# Patient Record
Sex: Male | Born: 1970 | Race: White | Hispanic: No | Marital: Married | Smoking: Never smoker
Health system: Southern US, Community
[De-identification: ages and names within clinical notes are randomized; demographics above are authoritative.]

## PROBLEM LIST (undated history)

## (undated) DIAGNOSIS — F418 Other specified anxiety disorders: Secondary | ICD-10-CM

## (undated) DIAGNOSIS — G5621 Lesion of ulnar nerve, right upper limb: Secondary | ICD-10-CM

## (undated) DIAGNOSIS — E041 Nontoxic single thyroid nodule: Secondary | ICD-10-CM

## (undated) DIAGNOSIS — E042 Nontoxic multinodular goiter: Secondary | ICD-10-CM

## (undated) DIAGNOSIS — T884XXA Failed or difficult intubation, initial encounter: Secondary | ICD-10-CM

## (undated) DIAGNOSIS — K589 Irritable bowel syndrome without diarrhea: Secondary | ICD-10-CM

## (undated) DIAGNOSIS — T7840XA Allergy, unspecified, initial encounter: Secondary | ICD-10-CM

## (undated) DIAGNOSIS — K8020 Calculus of gallbladder without cholecystitis without obstruction: Secondary | ICD-10-CM

## (undated) DIAGNOSIS — M1990 Unspecified osteoarthritis, unspecified site: Secondary | ICD-10-CM

## (undated) DIAGNOSIS — J189 Pneumonia, unspecified organism: Secondary | ICD-10-CM

## (undated) DIAGNOSIS — R768 Other specified abnormal immunological findings in serum: Secondary | ICD-10-CM

## (undated) DIAGNOSIS — I10 Essential (primary) hypertension: Secondary | ICD-10-CM

## (undated) DIAGNOSIS — M069 Rheumatoid arthritis, unspecified: Secondary | ICD-10-CM

## (undated) DIAGNOSIS — E785 Hyperlipidemia, unspecified: Secondary | ICD-10-CM

## (undated) DIAGNOSIS — F419 Anxiety disorder, unspecified: Secondary | ICD-10-CM

## (undated) DIAGNOSIS — K219 Gastro-esophageal reflux disease without esophagitis: Secondary | ICD-10-CM

## (undated) DIAGNOSIS — F411 Generalized anxiety disorder: Secondary | ICD-10-CM

## (undated) DIAGNOSIS — K638219 Small intestinal bacterial overgrowth, unspecified: Secondary | ICD-10-CM

## (undated) HISTORY — DX: Hyperlipidemia, unspecified: E78.5

## (undated) HISTORY — DX: Small intestinal bacterial overgrowth, unspecified: K63.8219

## (undated) HISTORY — DX: Failed or difficult intubation, initial encounter: T88.4XXA

## (undated) HISTORY — PX: VASECTOMY: SHX75

## (undated) HISTORY — DX: Nontoxic single thyroid nodule: E04.1

## (undated) HISTORY — DX: Unspecified osteoarthritis, unspecified site: M19.90

## (undated) HISTORY — DX: Other specified abnormal immunological findings in serum: R76.8

## (undated) HISTORY — DX: Allergy, unspecified, initial encounter: T78.40XA

## (undated) HISTORY — PX: CHOLECYSTECTOMY: SHX55

## (undated) HISTORY — PX: WISDOM TOOTH EXTRACTION: SHX21

## (undated) HISTORY — DX: Nontoxic multinodular goiter: E04.2

## (undated) HISTORY — PX: OTHER SURGICAL HISTORY: SHX169

## (undated) HISTORY — DX: Irritable bowel syndrome, unspecified: K58.9

## (undated) HISTORY — PX: KNEE SURGERY: SHX244

## (undated) HISTORY — DX: Gastro-esophageal reflux disease without esophagitis: K21.9

## (undated) HISTORY — DX: Calculus of gallbladder without cholecystitis without obstruction: K80.20

## (undated) HISTORY — PX: TONSILLECTOMY: SUR1361

## (undated) HISTORY — DX: Essential (primary) hypertension: I10

## (undated) HISTORY — DX: Generalized anxiety disorder: F41.1

## (undated) HISTORY — PX: HERNIA REPAIR: SHX51

## (undated) HISTORY — PX: INGUINAL HERNIA REPAIR: SUR1180

---
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
## 2007-05-14 ENCOUNTER — Encounter: Admission: RE | Admit: 2007-05-14 | Discharge: 2007-05-14 | Payer: Self-pay | Admitting: Rheumatology

## 2008-03-01 ENCOUNTER — Emergency Department: Payer: Self-pay | Admitting: Emergency Medicine

## 2012-01-08 ENCOUNTER — Ambulatory Visit (INDEPENDENT_AMBULATORY_CARE_PROVIDER_SITE_OTHER): Payer: Self-pay | Admitting: Family Medicine

## 2012-01-08 ENCOUNTER — Encounter: Payer: Self-pay | Admitting: Family Medicine

## 2012-01-08 VITALS — BP 137/91 | HR 90 | Wt 260.0 lb

## 2012-01-08 DIAGNOSIS — R1031 Right lower quadrant pain: Secondary | ICD-10-CM

## 2012-01-08 DIAGNOSIS — R109 Unspecified abdominal pain: Secondary | ICD-10-CM | POA: Insufficient documentation

## 2012-01-08 LAB — CBC WITH DIFFERENTIAL/PLATELET
Eosinophils Absolute: 0.2 10*3/uL (ref 0.0–0.7)
Eosinophils Relative: 2 % (ref 0–5)
HCT: 46 % (ref 39.0–52.0)
Hemoglobin: 16.2 g/dL (ref 13.0–17.0)
Lymphs Abs: 2.3 10*3/uL (ref 0.7–4.0)
MCH: 30.3 pg (ref 26.0–34.0)
MCV: 86 fL (ref 78.0–100.0)
Monocytes Absolute: 0.5 10*3/uL (ref 0.1–1.0)
Monocytes Relative: 8 % (ref 3–12)
Neutrophils Relative %: 56 % (ref 43–77)
RBC: 5.35 MIL/uL (ref 4.22–5.81)

## 2012-01-08 LAB — COMPLETE METABOLIC PANEL WITH GFR
Albumin: 4.3 g/dL (ref 3.5–5.2)
CO2: 29 mEq/L (ref 19–32)
GFR, Est African American: >89 mL/min
GFR, Est Non African American: 88 mL/min
Glucose, Bld: 80 mg/dL (ref 70–99)
Sodium: 141 mEq/L (ref 135–145)
Total Bilirubin: 0.7 mg/dL (ref 0.3–1.2)
Total Protein: 7 g/dL (ref 6.0–8.3)

## 2012-01-08 NOTE — Progress Notes (Signed)
CC: Eugene Coleman is a 41 y.o. male is here for Establish Care and Abdominal Pain   Subjective: HPI:  Very pleasant 41 year old presents for right-sided abdominal pain is been present for 3 months. This started days after he quit caffeinated beverages and chewing tobacco. Is described as a pressure and a bloating in the right mid to right lower quadrant of his abdomen. It is present 24 hours a day and is worse with running, deep breaths, and if he eats more than small amounts of food at a meal. Interestingly he denies any weight loss, if anything he believes she's gained 20 pounds in the last 3 months. He reports some constipation initially but has been using stool softeners and magnesium citrate to keep a daily nonpainful bowel movement. The pain is typically not radiating but he noticed a couple weeks ago but it did radiate to the right lower flank. He denies mucus or blood in the stool nor tar-like stool. Often the pain is to a degree where he breaks out in a sweat. Typically the pain as a 6/10 in severity. He denies fevers, chills, night sweats, joint pain, eye complaints, skin complaints, shortness of breath, chest pain, genitourinary complaints, nausea, vomiting, right upper quadrant pain, back pain, right scapular pain. Interventions have also included ranitidine without any resolution of his pain.  No known family history of colon cancer, mother does have Crohn's disease.  Review Of Systems Outlined In HPI  History reviewed. No pertinent past medical history.   Family History  Problem Relation Age of Onset  . Breast cancer Mother   . Hypertension Mother     father  . Crohn's disease Mother   . Heart attack      grandfather  . Stroke      grandfather     History  Substance Use Topics  . Smoking status: Never Smoker   . Smokeless tobacco: Former Neurosurgeon    Quit date: 01/08/1999  . Alcohol Use: No     Objective: Filed Vitals:   01/08/12 0833  BP: 137/91  Pulse: 90     General: Alert and Oriented, No Acute Distress HEENT: Pupils equal, round, reactive to light. Conjunctivae clear.  Moist mucous membranes, pharynx without inflammation nor lesions.  Lungs: Clear to auscultation bilaterally, no wheezing/ronchi/rales.  Comfortable work of breathing. Good air movement. Cardiac: Regular rate and rhythm. Normal S1/S2.  No murmurs, rubs, nor gallops.   Abdomen: Normal bowel sounds, no midline pulsatile mass, Murphy's sign negative, no right upper quadrant pain, no palpable hepatomegaly, no palpable masses. Pain with deep palpation in the superior right lower quadrant without rebound tenderness, no pain with heel strike. No palpable hernias with Valsalva Skin: Warm and dry.  Assessment & Plan: Eugene Coleman was seen today for establish care and abdominal pain.  Diagnoses and associated orders for this visit:  Right lower quadrant pain - Fecal lactoferrin - CBC with Differential - COMPLETE METABOLIC PANEL WITH GFR - Hemoccult (HOME) Blood/Stoool Cards; Future    Concern for mass versus Crohn's versus hepatobiliary disease. Patient does not have insurance therefore he would like to hold off on imaging at this time until labs help narrow down the differential. He's going to start actively looking for insurance coverage. I will call him with the above results to help determine appropriate followup.  Return f/u based on results.

## 2012-04-08 ENCOUNTER — Emergency Department
Admission: EM | Admit: 2012-04-08 | Discharge: 2012-04-08 | Disposition: A | Discharge status: Home / Self Care | Payer: Self-pay | Source: Home / Self Care | Attending: Family Medicine | Admitting: Family Medicine

## 2012-04-08 NOTE — ED Notes (Addendum)
Eugene Coleman complains of swelling in his gums and pain that is a 10/10. He had his tooth pulled on the 17 th of January by Dr Josetta Huddle. Dr Junie Spencer office is closed for the next three days. Denies fever, chills or sweats.

## 2012-04-25 ENCOUNTER — Other Ambulatory Visit: Payer: Self-pay

## 2012-08-06 ENCOUNTER — Encounter: Payer: Self-pay | Admitting: *Deleted

## 2012-08-06 ENCOUNTER — Emergency Department
Admission: EM | Admit: 2012-08-06 | Discharge: 2012-08-06 | Disposition: A | Discharge status: Home / Self Care | Payer: Self-pay | Source: Home / Self Care | Attending: Family Medicine | Admitting: Family Medicine

## 2012-08-06 DIAGNOSIS — J069 Acute upper respiratory infection, unspecified: Secondary | ICD-10-CM

## 2012-08-06 DIAGNOSIS — J40 Bronchitis, not specified as acute or chronic: Secondary | ICD-10-CM

## 2012-08-06 MED ORDER — AZITHROMYCIN 250 MG PO TABS: ORAL_TABLET | ORAL | Status: DC

## 2012-08-06 NOTE — ED Provider Notes (Signed)
History     CSN: [id number]  Arrival date & time 08/06/12  1020   First MD Initiated Contact with Patient 08/06/12 1034      Chief Complaint  Patient presents with  . URI   HPI  URI Symptoms Onset: 2 weeks  Description: rhinorrhea, nasal congestion, cough  Modifying factors:  Sxs initially improved, then recurred. Baseline hx/o allergies. Also with hx/o PNA 2012.   Symptoms Nasal discharge: yes Fever: no Sore throat: no Cough: yes Wheezing: no Ear pain: no GI symptoms: no Sick contacts: no  Red Flags  Stiff neck: no Dyspnea: no Rash: no Swallowing difficulty: no  Sinusitis Risk Factors Headache/face pain: no Double sickening: no tooth pain: no  Allergy Risk Factors Sneezing: yes Itchy scratchy throat: yes Seasonal symptoms: yes  Flu Risk Factors Headache: no muscle aches: no severe fatigue: no   History reviewed. No pertinent past medical history.  Past Surgical History  Procedure Laterality Date  . Knee surgery  [phone]  . Tonsillectomy      Family History  Problem Relation Age of Onset  . Breast cancer Mother   . Hypertension Mother     father  . Crohn's disease Mother   . Heart attack      grandfather  . Stroke      grandfather    History  Substance Use Topics  . Smoking status: Never Smoker   . Smokeless tobacco: Former Neurosurgeon    Quit date: 01/08/1999  . Alcohol Use: No      Review of Systems  All other systems reviewed and are negative.    Allergies  Penicillins  Home Medications  No current outpatient prescriptions on file.  BP 133/85  Pulse 66  Temp(Src) 98 F (36.7 C) (Oral)  Resp 14  Ht 6\' 3"  (1.905 m)  Wt 256 lb (116.121 kg)  BMI 32 kg/m2  SpO2 96%  Physical Exam  Constitutional: He appears well-developed and well-nourished.  HENT:  Head: Normocephalic and atraumatic.  Eyes: Conjunctivae are normal. Pupils are equal, round, and reactive to light.  Neck: Normal range of motion.  Cardiovascular:  Normal rate and regular rhythm.   Pulmonary/Chest: Effort normal and breath sounds normal. He has no wheezes.  Abdominal: Soft.  Musculoskeletal: Normal range of motion.  Neurological: He is alert.  Skin: Skin is warm.    ED Course  Procedures (including critical care time)  Labs Reviewed - No data to display No results found.   1. URI (upper respiratory infection)   2. Bronchitis       MDM  Will place on zpak for lower resp/atypical coverage Discussed infectious and resp red flags.  Follow up as needed.      The patient and/or caregiver has been counseled thoroughly with regard to treatment plan and/or medications prescribed including dosage, schedule, interactions, rationale for use, and possible side effects and they verbalize understanding. Diagnoses and expected course of recovery discussed and will return if not improved as expected or if the condition worsens. Patient and/or caregiver verbalized understanding.             Doree Albee, MD 08/06/12 1044

## 2012-08-06 NOTE — ED Notes (Signed)
Eugene Coleman c/o cold/uri symptoms 1 weeks ago. Most symptoms have resolved but he has developed a productive cough with green sputum. Denies fever.

## 2012-11-06 ENCOUNTER — Encounter: Payer: Self-pay | Admitting: Family Medicine

## 2012-11-06 ENCOUNTER — Ambulatory Visit (INDEPENDENT_AMBULATORY_CARE_PROVIDER_SITE_OTHER): Payer: Self-pay | Admitting: Family Medicine

## 2012-11-06 VITALS — BP 154/104 | HR 70 | Wt 272.0 lb

## 2012-11-06 DIAGNOSIS — F411 Generalized anxiety disorder: Secondary | ICD-10-CM

## 2012-11-06 DIAGNOSIS — F410 Panic disorder [episodic paroxysmal anxiety] without agoraphobia: Secondary | ICD-10-CM | POA: Insufficient documentation

## 2012-11-06 DIAGNOSIS — I10 Essential (primary) hypertension: Secondary | ICD-10-CM

## 2012-11-06 HISTORY — DX: Generalized anxiety disorder: F41.1

## 2012-11-06 MED ORDER — PAROXETINE HCL 20 MG PO TABS: 20.0000 mg | ORAL_TABLET | Freq: Every day | ORAL | Status: DC

## 2012-11-06 NOTE — Progress Notes (Signed)
CC: Eugene Coleman is a 42 y.o. male is here for Stress   Subjective: HPI:  Patient presents with concerns of anxiety is not getting in the way of his quality of life. Is described as moderate in severity present on a daily basis worse when at his work improves greatly with exercise or when spending time with his family. He has been self-medicating with coffee drinking 4-5 cups everyday unfortunately this interferes with his ability to exercise as he has no desire to exercise while taking this much coffee.  When he was in his mid 31s he was taking Paxil 20 mg daily for about 7 years for anxiety and depression. Since stopping this medication he is noticed that he has anxiety with being in large social situations and also feels somewhat subjectively depressed when his anxiety is at its worst. Denies paranoia, hallucinations, sleep disturbance, appetite change, possibly harm himself or others. Denies alcohol or recreational drug use. Home life is fantastic, job his only source of stress  Patient denies chest pain, shortness of breath, orthopnea, peripheral edema or motor sensory disturbances   Review Of Systems Outlined In HPI  Past Medical History  Diagnosis Date  . Generalized anxiety disorder 11/06/2012     Family History  Problem Relation Age of Onset  . Breast cancer Mother   . Hypertension Mother     father  . Crohn's disease Mother   . Heart attack      grandfather  . Stroke      grandfather     History  Substance Use Topics  . Smoking status: Never Smoker   . Smokeless tobacco: Former Neurosurgeon    Quit date: 01/08/1999  . Alcohol Use: No     Objective: Filed Vitals:   11/06/12 0930  BP: 154/104  Pulse: 70    Vital signs reviewed. General: Alert and Oriented, No Acute Distress HEENT: Pupils equal, round, reactive to light. Conjunctivae clear.  External ears unremarkable.  Moist mucous membranes. Lungs: Clear and comfortable work of breathing, speaking in full  sentences without accessory muscle use. Cardiac: Regular rate and rhythm.  Neuro: CN II-XII grossly intact, gait normal. Extremities: No peripheral edema.  Strong peripheral pulses.  Mental Status: No depression, anxiety, nor agitation. Logical though process. Skin: Warm and dry.  Assessment & Plan: Koleton was seen today for stress.  Diagnoses and associated orders for this visit:  Essential hypertension, benign  Generalized anxiety disorder - PARoxetine (PAXIL) 20 MG tablet; Take 1 tablet (20 mg total) by mouth daily.    Generalized anxiety disorder: Worsened, we discussed that exercise would be quite helpful for this, he is going to cut back on coffee he would like to restart Paxil, we discussed possibly using citalopram for financial reasons however he has more psychological confidence with Paxil. Discuss formal diagnosis of essential hypertension: He is from reservations about starting blood pressure medication but is agreeable to considering this if not improved with cutting back on coffee restarting exercise cutting sodium intake  Return in about 4 weeks (around 12/04/2012).

## 2012-12-09 ENCOUNTER — Encounter: Payer: Self-pay | Admitting: Family Medicine

## 2012-12-09 ENCOUNTER — Ambulatory Visit (INDEPENDENT_AMBULATORY_CARE_PROVIDER_SITE_OTHER): Payer: Self-pay | Admitting: Family Medicine

## 2012-12-09 VITALS — BP 137/87 | HR 87 | Wt 273.0 lb

## 2012-12-09 DIAGNOSIS — Z09 Encounter for follow-up examination after completed treatment for conditions other than malignant neoplasm: Secondary | ICD-10-CM

## 2012-12-09 DIAGNOSIS — I10 Essential (primary) hypertension: Secondary | ICD-10-CM

## 2012-12-09 DIAGNOSIS — F419 Anxiety disorder, unspecified: Secondary | ICD-10-CM

## 2012-12-09 DIAGNOSIS — K219 Gastro-esophageal reflux disease without esophagitis: Secondary | ICD-10-CM | POA: Insufficient documentation

## 2012-12-09 DIAGNOSIS — G4700 Insomnia, unspecified: Secondary | ICD-10-CM

## 2012-12-09 HISTORY — DX: Anxiety disorder, unspecified: F41.9

## 2012-12-09 MED ORDER — ZOLPIDEM TARTRATE 5 MG PO TABS: 5.0000 mg | ORAL_TABLET | Freq: Every evening | ORAL | Status: DC | PRN

## 2012-12-09 MED ORDER — PANTOPRAZOLE SODIUM 40 MG PO TBEC: 40.0000 mg | DELAYED_RELEASE_TABLET | Freq: Every day | ORAL | Status: DC

## 2012-12-09 MED ORDER — METOPROLOL TARTRATE 25 MG PO TABS: 25.0000 mg | ORAL_TABLET | Freq: Two times a day (BID) | ORAL | Status: DC

## 2012-12-09 NOTE — Progress Notes (Signed)
CC: Eugene Coleman is a 42 y.o. male is here for hospital f/u   Subjective: HPI:  Hospital followup for admission September 29 through 30th for chest pain. Workup included normal chest x-ray, normal EKG, normal serial cardiac enzymes, normal nuclear stress with EF of 53%, A1c of 5.3, normal TSH, normal CMP, hemoglobin of 15.7. LDL 118 triglycerides 122.  There was a suspicion that his pain was coming from gastric reflux he was switched from ranitidine to Protonix 40 mg taken daily.  He describes the chest pain occurred on Friday and Saturday and Sunday he was described as a squeezing deep in his chest that would last for minutes gradually would start gradually would decrease with nothing particularly making it better or worse present with activity and at rest. It would radiate into the left arm. Did not respond to nitroglycerin, noticed few subjective improvement once he was started on metoprolol.  Since discharge he denies any chest discomfort shortness of breath nor motor or sensory disturbances.  Patient has not started Protonix yet he did not get a prescription at time of discharge. He currently denies any epigastric pain, sternal discomfort nor dysphagia.  Patient reports that his anxiety was significantly improved soon after starting on metoprolol in the hospital anxiety has returned since discharge since he did not get a prescription of this. He did not tolerate Paxil due to confusion when he restarted this last month  Patient complains of trouble sleeping that has been present ever since switching shifts 3 weeks ago. Described as inability to stay asleep longer than 2 hours with some difficulty falling asleep. Moderate severity has not been getting better or worse since onset. Denies mental disturbance other than above, paranoia, nor pain keeping him awake at night   Review Of Systems Outlined In HPI  Past Medical History  Diagnosis Date  . Generalized anxiety disorder 11/06/2012      Family History  Problem Relation Age of Onset  . Breast cancer Mother   . Hypertension Mother     father  . Crohn's disease Mother   . Heart attack      grandfather  . Stroke      grandfather     History  Substance Use Topics  . Smoking status: Never Smoker   . Smokeless tobacco: Former Neurosurgeon    Quit date: 01/08/1999  . Alcohol Use: No     Objective: Filed Vitals:   12/09/12 1401  BP: 137/87  Pulse: 87    General: Alert and Oriented, No Acute Distress HEENT: Pupils equal, round, reactive to light. Conjunctivae clear.  Moist mucous membranes pharynx unremarkable Lungs: Clear to auscultation bilaterally, no wheezing/ronchi/rales.  Comfortable work of breathing. Good air movement. Cardiac: Regular rate and rhythm. Normal S1/S2.  No murmurs, rubs, nor gallops.   Abdomen: Normal bowel sounds, soft and non tender without palpable masses. Extremities: No peripheral edema.  Strong peripheral pulses.  Mental Status: No depression, anxiety, nor agitation. Skin: Warm and dry.  Assessment & Plan: Gurkaran was seen today for hospital f/u.  Diagnoses and associated orders for this visit:  Hospital discharge follow-up  Insomnia - zolpidem (AMBIEN) 5 MG tablet; Take 1 tablet (5 mg total) by mouth at bedtime as needed for sleep.  Essential hypertension, benign - metoprolol tartrate (LOPRESSOR) 25 MG tablet; Take 1 tablet (25 mg total) by mouth 2 (two) times daily.  GERD (gastroesophageal reflux disease) - pantoprazole (PROTONIX) 40 MG tablet; Take 1 tablet (40 mg total) by mouth daily.  I suspect that his chest discomfort was due to anxiety and/or GERD. Given his significant improvement of anxiety and blood pressure he is open to the idea of continuing metoprolol prescription was provided. Essential hypertension: Start metoprolol GERD: Suspect this is uncontrolled due to chest pain, Start Protonix Insomnia: Start Ambien  40 minutes spent face-to-face during visit today  of which at least 50% was counseling or coordinating care regarding Hospital discharge followup, insomnia, essential hypertension, anxiety, GERD.   Return in about 4 weeks (around 01/06/2013).

## 2013-01-14 ENCOUNTER — Other Ambulatory Visit: Payer: Self-pay

## 2014-03-30 ENCOUNTER — Encounter: Payer: Self-pay | Admitting: Family Medicine

## 2014-03-30 ENCOUNTER — Ambulatory Visit (INDEPENDENT_AMBULATORY_CARE_PROVIDER_SITE_OTHER): Payer: Self-pay | Admitting: Family Medicine

## 2014-03-30 VITALS — BP 151/87 | HR 122 | Wt 256.0 lb

## 2014-03-30 DIAGNOSIS — I10 Essential (primary) hypertension: Secondary | ICD-10-CM

## 2014-03-30 DIAGNOSIS — F411 Generalized anxiety disorder: Secondary | ICD-10-CM

## 2014-03-30 MED ORDER — PAROXETINE HCL 20 MG PO TABS: 20.0000 mg | ORAL_TABLET | Freq: Every day | ORAL | Status: DC

## 2014-03-30 MED ORDER — METOPROLOL SUCCINATE ER 25 MG PO TB24: 25.0000 mg | ORAL_TABLET | Freq: Every day | ORAL | Status: DC

## 2014-03-30 NOTE — Progress Notes (Signed)
CC: Eugene Coleman is a 44 y.o. male is here for wants to restart Paxil   Subjective: HPI:  Follow-up anxiety: I saw him last he decided not to continue on Paxil because it was causing abdominal pain. 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 He states that 2 months ago he began to feel like his social anxiety was returning. He had some leftover Paxil and restarted on taking 10-20 milligrams every night. Within a week he states that anxiety was completely absent and he has not experienced any mental disturbance since that time. He wants to know if he can continue on Paxil because he feels the best that he has from a psychological standpoint in years. He denies any known side effects nor thoughts of wanting to harm himself or others. He's also got back into running and running 5-9 miles 3 times a week. He states that this helps greatly with both blood pressure and his mood.  Follow-up essential hypertension: He decided to stop taking metoprolol as it was making her tired. He's been checking his blood pressure at home and it is consistently below 140/90. Denies chest pain shortness of breath orthopnea nor peripheral edema   Review Of Systems Outlined In HPI  Past Medical History  Diagnosis Date  . Generalized anxiety disorder 11/06/2012    Past Surgical History  Procedure Laterality Date  . Knee surgery  [phone]  . Tonsillectomy     Family History  Problem Relation Age of Onset  . Breast cancer Mother   . Hypertension Mother     father  . Crohn's disease Mother   . Heart attack      grandfather  . Stroke      grandfather    History   Social History  . Marital Status: Married    Spouse Name: N/A    Number of Children: N/A  . Years of Education: N/A   Occupational History  . Not on file.   Social History Main  Topics  . Smoking status: Never Smoker   . Smokeless tobacco: Former Systems developer    Quit date: 01/08/1999  . Alcohol Use: No  . Drug Use: No  . Sexual Activity: Yes   Other Topics Concern  . Not on file   Social History Narrative     Objective: BP 151/87 mmHg  Pulse 122  Wt 256 lb (116.121 kg)  General: Alert and Oriented, No Acute Distress HEENT: Pupils equal, round, reactive to light. Conjunctivae clear.  Moist mucous membranes Lungs: Clear to auscultation bilaterally, no wheezing/ronchi/rales.  Comfortable work of breathing. Good air movement. Cardiac: Regular rate and rhythm. Normal S1/S2.  No murmurs, rubs, nor gallops.   Extremities: No peripheral edema.  Strong peripheral pulses.  Mental Status: No depression, anxiety, nor agitation. Skin: Warm and dry.  Assessment & Plan: Eugene Coleman was seen today for wants to restart paxil.  Diagnoses and associated orders for this visit:  Generalized anxiety disorder - PARoxetine (PAXIL) 20 MG tablet; Take 1 tablet (20 mg total) by mouth daily.  Essential hypertension, benign - metoprolol succinate (TOPROL-XL) 25 MG 24 hr tablet; Take 1 tablet (25 mg total) by mouth daily.     generalized anxiety disorder: Controlled on current dose of Paxil, continue to take 10-20 milligrams every evening.   essential hypertension: Uncontrolled,  I've asked him to restart metoprolol if he notices that blood pressures outside of our office or above 140/90. Continue to exercise, congratulated his success with running.   Return for 6-12 month mood follow up.

## 2014-06-06 ENCOUNTER — Telehealth: Payer: Self-pay | Admitting: Family Medicine

## 2014-06-06 ENCOUNTER — Telehealth: Payer: Self-pay | Admitting: *Deleted

## 2014-06-06 DIAGNOSIS — F411 Generalized anxiety disorder: Secondary | ICD-10-CM

## 2014-06-06 MED ORDER — PAROXETINE HCL 20 MG PO TABS: 30.0000 mg | ORAL_TABLET | Freq: Every day | ORAL | Status: DC

## 2014-06-06 NOTE — Telephone Encounter (Signed)
Patient walked-in stated he was told his appt was today but it is tomorrow instead and pt states he has 1 refill of Paxil left because he is out of it and the pharmacy told him it is too early to get filled and he wants to know if our office can send a message to the pharmacy so he can get it refilled today. I adv pt I would send a message to the nurse. Thanks

## 2014-06-06 NOTE — Telephone Encounter (Signed)
Pt should still have plenty of medication if taking it as rx'ed. Called pharm to verify if a 90 day supply was dispensed on 03/30/14 and they did confirm this. They agreed that pt should still have medication. Called and left message on pt's vm to call back. Want to make sure he is just taking it once a day. If having to take more pt needs to come in for appt

## 2014-06-06 NOTE — Telephone Encounter (Signed)
Called pt back and he is actually taking 30 mg once a day. He said taking the 20 mg dose was not helping him so he adjusted the medication on his own. Pt has an appt tomorrow at 230. He is out of medication. He is aware that you may wait until appt time to discuss any dosage adjustments

## 2014-06-06 NOTE — Telephone Encounter (Signed)
Pt.notified

## 2014-06-06 NOTE — Telephone Encounter (Signed)
Small Rx of 30mg  script was just now sent to [facility] so as to not interrupt his dosing frequency, keep F/U with me on the 29th.

## 2014-06-07 ENCOUNTER — Ambulatory Visit: Payer: Self-pay | Admitting: Family Medicine

## 2014-06-08 ENCOUNTER — Ambulatory Visit (INDEPENDENT_AMBULATORY_CARE_PROVIDER_SITE_OTHER): Payer: Self-pay | Admitting: Family Medicine

## 2014-06-08 ENCOUNTER — Encounter: Payer: Self-pay | Admitting: Family Medicine

## 2014-06-08 VITALS — BP 157/96 | HR 51 | Ht 75.0 in | Wt 268.0 lb

## 2014-06-08 DIAGNOSIS — F411 Generalized anxiety disorder: Secondary | ICD-10-CM

## 2014-06-08 DIAGNOSIS — I10 Essential (primary) hypertension: Secondary | ICD-10-CM

## 2014-06-08 MED ORDER — PAROXETINE HCL 20 MG PO TABS: 30.0000 mg | ORAL_TABLET | Freq: Every day | ORAL | Status: DC

## 2014-06-08 NOTE — Progress Notes (Signed)
CC: Eugene Coleman is a 44 y.o. male is here for Follow-up   Subjective: HPI:  Follow-up anxiety: He found that 20 mg of Paxil was losing its effectiveness. He was having difficulty with social anxiety but no other degree of anxiety. He extremities by taking 30 mg over the course of the day, 10 mg space equally throughout the day about every 8 hours. He's noticed that this drastically improved his social anxiety and provided no side effects. He also tried taking up to 40 mg daily however caused intolerable fatigue. He denies any other known side effects from Paxil. He denies any depression or any other mental disturbance. He tells me that he has the best outlook on life that he's had in years.  Follow-up essential hypertension: Continues to take metoprolol on a daily basis. He ran a half marathon last month and afterwards stopped his running routine. Denies chest pain shortness of breath orthopnea nor peripheral edema   Review Of Systems Outlined In HPI  Past Medical History  Diagnosis Date  . Generalized anxiety disorder 11/06/2012    Past Surgical History  Procedure Laterality Date  . Knee surgery  [phone]  . Tonsillectomy     Family History  Problem Relation Age of Onset  . Breast cancer Mother   . Hypertension Mother     father  . Crohn's disease Mother   . Heart attack      grandfather  . Stroke      grandfather    History   Social History  . Marital Status: Married    Spouse Name: N/A  . Number of Children: N/A  . Years of Education: N/A   Occupational History  . Not on file.   Social History Main Topics  . Smoking status: Never Smoker   . Smokeless tobacco: Former Systems developer    Quit date: 01/08/1999  . Alcohol Use: No  . Drug Use: No  . Sexual Activity: Yes   Other Topics Concern  . Not on file   Social History Narrative     Objective: BP 157/96 mmHg  Pulse 51  Ht 6\' 3"  (1.905 m)  Wt 268 lb (121.564 kg)  BMI 33.50 kg/m2  General: Alert and  Oriented, No Acute Distress HEENT: Pupils equal, round, reactive to light. Conjunctivae clear.  Moist mucous membranes Lungs: Clear to auscultation bilaterally, no wheezing/ronchi/rales.  Comfortable work of breathing. Good air movement. Cardiac: Regular rate and rhythm. Normal S1/S2.  No murmurs, rubs, nor gallops.   Extremities: No peripheral edema.  Strong peripheral pulses.  Mental Status: No depression, anxiety, nor agitation. Skin: Warm and dry.  Assessment & Plan: Eugene Coleman was seen today for follow-up.  Diagnoses and all orders for this visit:  Generalized anxiety disorder Orders: -     PARoxetine (PAXIL) 20 MG tablet; Take 1.5 tablets (30 mg total) by mouth daily.  Essential hypertension, benign   Generalized anxiety disorder: Controlled on 10 mg of Paxil spaced out every 8 hours. Essential hypertension: Uncontrolled, continue metoprolol, he is optimistic he can restart his running regimen which has brought his pressures into normotensive ranges in the past. Aiming for 5 miles most days of the week.  Return in about 3 months (around 09/08/2014) for CPE.

## 2014-07-04 ENCOUNTER — Other Ambulatory Visit: Payer: Self-pay | Admitting: Family Medicine

## 2014-07-06 ENCOUNTER — Encounter: Payer: Self-pay | Admitting: Family Medicine

## 2014-07-12 ENCOUNTER — Ambulatory Visit (INDEPENDENT_AMBULATORY_CARE_PROVIDER_SITE_OTHER): Payer: BLUE CROSS/BLUE SHIELD | Admitting: Family Medicine

## 2014-07-12 ENCOUNTER — Encounter: Payer: Self-pay | Admitting: Family Medicine

## 2014-07-12 VITALS — BP 131/87 | HR 101 | Ht 75.0 in | Wt 272.0 lb

## 2014-07-12 DIAGNOSIS — IMO0002 Reserved for concepts with insufficient information to code with codable children: Secondary | ICD-10-CM

## 2014-07-12 DIAGNOSIS — F521 Sexual aversion disorder: Secondary | ICD-10-CM

## 2014-07-12 DIAGNOSIS — R6882 Decreased libido: Secondary | ICD-10-CM

## 2014-07-12 NOTE — Progress Notes (Signed)
CC: Eugene Coleman is a 44 y.o. male is here for side effects from medicine   Subjective: HPI:  Complains of anorgasmia and low libido but no erectile issues that has been present for the past few months. It has not been bothering him until lately now that his wife is concerned that this is a reflection that he is no longer retracted her. He's been taking Paxil 20 mg daily and he's been on this dose in the past without any sexual side effects. Symptoms are moderate to severe in severity and now significantly interfering with his marriage. He is extremely happy with his current dose of Paxil and does not want to entertain the idea of switching to a different medication. He denies any genitourinary complaints other than that above. No dysuria, penile pain nor genital lesions. Denies any anxiety or depression.   Review Of Systems Outlined In HPI  Past Medical History  Diagnosis Date  . Generalized anxiety disorder 11/06/2012    Past Surgical History  Procedure Laterality Date  . Knee surgery  [phone]  . Tonsillectomy     Family History  Problem Relation Age of Onset  . Breast cancer Mother   . Hypertension Mother     father  . Crohn's disease Mother   . Heart attack      grandfather  . Stroke      grandfather    History   Social History  . Marital Status: Married    Spouse Name: N/A  . Number of Children: N/A  . Years of Education: N/A   Occupational History  . Not on file.   Social History Main Topics  . Smoking status: Never Smoker   . Smokeless tobacco: Former Systems developer    Quit date: 01/08/1999  . Alcohol Use: No  . Drug Use: No  . Sexual Activity: Yes   Other Topics Concern  . Not on file   Social History Narrative     Objective: BP 131/87 mmHg  Pulse 101  Ht 6\' 3"  (1.905 m)  Wt 272 lb (123.378 kg)  BMI 34.00 kg/m2  Vital signs reviewed. General: Alert and Oriented, No Acute Distress HEENT: Pupils equal, round, reactive to light. Conjunctivae  clear.  External ears unremarkable.  Moist mucous membranes. Lungs: Clear and comfortable work of breathing, speaking in full sentences without accessory muscle use. Cardiac: Regular rate and rhythm.  Neuro: CN II-XII grossly intact, gait normal. Extremities: No peripheral edema.  Strong peripheral pulses.  Mental Status: No depression, anxiety, nor agitation. Logical though process. Skin: Warm and dry. Assessment & Plan: Eugene Coleman was seen today for side effects from medicine.  Diagnoses and all orders for this visit:  Low libido Orders: -     Testosterone  Anorgasmia Orders: -     Testosterone   Discussed the side effects are extremely common and almost anticipated with Paxil, given that he is only developed the symptoms recently will look for reversible causes such as hypogonadism and will treat if necessary. If this comes back normal entertain the idea of adding a low-dose of Wellbutrin.  Return if symptoms worsen or fail to improve.

## 2014-07-13 ENCOUNTER — Telehealth: Payer: Self-pay | Admitting: Family Medicine

## 2014-07-13 DIAGNOSIS — E291 Testicular hypofunction: Secondary | ICD-10-CM

## 2014-07-13 DIAGNOSIS — Z79899 Other long term (current) drug therapy: Secondary | ICD-10-CM

## 2014-07-13 LAB — TESTOSTERONE: Testosterone: 350 ng/dL (ref 300–890)

## 2014-07-13 NOTE — Telephone Encounter (Signed)
Eugene Coleman, Will you please let patient know that his testosterone was at the lower limit of normal which could be causing his concerns we discussed yesterday.  As I mentioned yesterday the next step would be to check a PSA value and his hemoglobin level to make sure testosterone supplementation is a safe option.  Lab slips in your inbox.

## 2014-07-13 NOTE — Telephone Encounter (Signed)
Pt notified  And labs faxed

## 2014-07-14 LAB — PSA: PSA: 0.1 ng/mL (ref <=4.00)

## 2014-07-14 LAB — HEMOGLOBIN: HEMOGLOBIN: 15.1 g/dL (ref 13.0–17.0)

## 2014-07-15 ENCOUNTER — Telehealth: Payer: Self-pay | Admitting: Family Medicine

## 2014-07-15 DIAGNOSIS — E291 Testicular hypofunction: Secondary | ICD-10-CM

## 2014-07-15 MED ORDER — TESTOSTERONE 20.25 MG/ACT (1.62%) TD GEL: TRANSDERMAL | Status: DC

## 2014-07-15 NOTE — Telephone Encounter (Signed)
Faxed

## 2014-07-15 NOTE — Telephone Encounter (Signed)
Seth Bake, Rx placed in in-box ready for pickup/faxing. (see recent result note)

## 2014-07-18 ENCOUNTER — Telehealth: Payer: Self-pay | Admitting: Family Medicine

## 2014-07-18 NOTE — Telephone Encounter (Signed)
Received fax for [place] on androgel sent through cover my meds waiting on auth. - CF

## 2014-07-19 NOTE — Telephone Encounter (Signed)
Received fax from [facility] they denied Androgel due to testosterone levels being normal - CF

## 2014-07-20 ENCOUNTER — Telehealth: Payer: Self-pay | Admitting: Family Medicine

## 2014-07-20 NOTE — Telephone Encounter (Signed)
Left message on pts cell phone

## 2014-07-20 NOTE — Telephone Encounter (Signed)
Seth Bake, Will you please let patient know that [employer] has denied coverage for topical testosterone because of their strict criteria that testosterone must be extremely low.  If he's still interested in testosterone replacement therapy the injection option is still available, I've added this to his med list if he wants to come in for nurse visits for this.

## 2014-07-21 ENCOUNTER — Ambulatory Visit (INDEPENDENT_AMBULATORY_CARE_PROVIDER_SITE_OTHER): Payer: BLUE CROSS/BLUE SHIELD | Admitting: Family Medicine

## 2014-07-21 VITALS — BP 132/83 | HR 96 | Wt 279.0 lb

## 2014-07-21 DIAGNOSIS — E291 Testicular hypofunction: Secondary | ICD-10-CM | POA: Diagnosis not present

## 2014-07-21 MED ORDER — TESTOSTERONE CYPIONATE 200 MG/ML IM SOLN
300.0000 mg | Freq: Once | INTRAMUSCULAR | Status: AC
  Administered 2014-07-21: 300 mg via INTRAMUSCULAR

## 2014-07-21 NOTE — Progress Notes (Signed)
   Subjective:    Patient ID: Eugene Coleman, male    DOB: Jan 10, 1971, 44 y.o.   MRN: [id number]  HPI  BILLYJACK TROMPETER is here for his first testosterone injection. Patient educated to call if he notices mood changes, shortness of breath, chest pain or injection site problems.  Review of Systems     Objective:   Physical Exam        Assessment & Plan:  Patient tolerated injection well without complications. Patient advised to schedule next injection 21 days from today.

## 2014-08-11 ENCOUNTER — Ambulatory Visit (INDEPENDENT_AMBULATORY_CARE_PROVIDER_SITE_OTHER): Payer: BLUE CROSS/BLUE SHIELD | Admitting: Family Medicine

## 2014-08-11 VITALS — BP 145/95 | HR 65 | Wt 282.0 lb

## 2014-08-11 DIAGNOSIS — E291 Testicular hypofunction: Secondary | ICD-10-CM | POA: Diagnosis not present

## 2014-08-11 MED ORDER — TESTOSTERONE CYPIONATE 200 MG/ML IM SOLN
300.0000 mg | Freq: Once | INTRAMUSCULAR | Status: AC
  Administered 2014-08-11: 300 mg via INTRAMUSCULAR

## 2014-08-11 NOTE — Progress Notes (Signed)
   Subjective:    Patient ID: Eugene Coleman, male    DOB: 07-13-1970, 44 y.o.   MRN: [id number]  HPI  Patient came into office today for testosterone injection. Denies chest pain, shortness of breath, headaches and problems associated with taking this medication. Patient states he has had no abnornal mood swings. Patient's BP was elevated today, Pt states he has not taken his BP Rx yet.  Review of Systems     Objective:   Physical Exam        Assessment & Plan:  Patient tolerated injection in [place] well without complications. Patient advised to schedule his next injection for 3 weeks from today.

## 2014-09-01 ENCOUNTER — Ambulatory Visit (INDEPENDENT_AMBULATORY_CARE_PROVIDER_SITE_OTHER): Payer: BLUE CROSS/BLUE SHIELD | Admitting: Family Medicine

## 2014-09-01 VITALS — BP 177/103 | HR 60 | Wt 292.0 lb

## 2014-09-01 DIAGNOSIS — E291 Testicular hypofunction: Secondary | ICD-10-CM

## 2014-09-01 DIAGNOSIS — I10 Essential (primary) hypertension: Secondary | ICD-10-CM | POA: Diagnosis not present

## 2014-09-01 MED ORDER — METOPROLOL SUCCINATE ER 50 MG PO TB24: 50.0000 mg | ORAL_TABLET | Freq: Every day | ORAL | Status: DC

## 2014-09-01 MED ORDER — TESTOSTERONE CYPIONATE 200 MG/ML IM SOLN
300.0000 mg | Freq: Once | INTRAMUSCULAR | Status: AC
  Administered 2014-09-01: 300 mg via INTRAMUSCULAR

## 2014-09-01 NOTE — Progress Notes (Signed)
   Subjective:    Patient ID: Eugene Coleman, male    DOB: 1971-01-26, 44 y.o.   MRN: [id number]  HPI  Patient came into office today for testosterone injection. Denies chest pain, shortness of breath, headaches and problems associated with taking this medication. Patient states he has had no abnornal mood swings. Pt BP is elevated today, he states "it has felt high this week." Pt reports no headaches but did get lightheaded yesterday while working in the heat.   Review of Systems     Objective:   Physical Exam        Assessment & Plan:  Patient tolerated injection in [place] well without complications. Patient advised to schedule his next injection for 3 weeks from today. Consulted with Dr. Ileene Rubens on Pt's BP, his Metoprolol rx will be increased from 25mg  to 50mg . Pt advised to take two of his current pills until he runs out then go by and get the new Rx. Verbalized understanding. No further questions.

## 2014-09-01 NOTE — Progress Notes (Signed)
I spent 5 minutes today talking to the patient about his blood pressure. He tells me that he does subjectively feels like it's been up lately. He tells me it feels like it's been up after drinking coffee and nothing else seems to make it better or worse. He denies any chest pain or motor or sensory disturbances nor headaches. He's been taking 25 mg of metoprolol daily without any known side effects. I recommended that he increase metoprolol to 50 mg daily and call me after a week if he still subjectively feels like his blood pressure is elevated.

## 2014-09-21 ENCOUNTER — Ambulatory Visit: Payer: BLUE CROSS/BLUE SHIELD | Admitting: Family Medicine

## 2014-09-22 ENCOUNTER — Ambulatory Visit (INDEPENDENT_AMBULATORY_CARE_PROVIDER_SITE_OTHER): Payer: BLUE CROSS/BLUE SHIELD | Admitting: Family Medicine

## 2014-09-22 VITALS — BP 149/88 | HR 61 | Wt 294.0 lb

## 2014-09-22 DIAGNOSIS — E291 Testicular hypofunction: Secondary | ICD-10-CM | POA: Diagnosis not present

## 2014-09-22 MED ORDER — TESTOSTERONE CYPIONATE 200 MG/ML IM SOLN
300.0000 mg | Freq: Once | INTRAMUSCULAR | Status: AC
  Administered 2014-09-22: 300 mg via INTRAMUSCULAR

## 2014-09-22 NOTE — Progress Notes (Signed)
   Subjective:    Patient ID: Eugene Coleman, male    DOB: 1970/08/25, 44 y.o.   MRN: [id number]  HPI  TREGAN READ is here for a testosterone injection. Denies chest pain, headaches or mood changes.   Hypertension - Leonides reports elevated blood pressure for the last few weeks. He increased his metoprolol from 50 mg to 75 mg.  Anxiety - He reports a 30 pound weight gain after starting Paxil. He complains of shortness of breath with the extra weight. He has decreased Paxil to 10 mg in hopes to lose the weight he has gained.   Review of Systems     Objective:   Physical Exam        Assessment & Plan:  Hypogonadism - Patient tolerated injection well without complications. Patient advised to schedule next injection 21 days from today.

## 2014-09-23 ENCOUNTER — Ambulatory Visit: Payer: BLUE CROSS/BLUE SHIELD | Admitting: Family Medicine

## 2014-09-27 ENCOUNTER — Ambulatory Visit (INDEPENDENT_AMBULATORY_CARE_PROVIDER_SITE_OTHER): Payer: BLUE CROSS/BLUE SHIELD | Admitting: Family Medicine

## 2014-09-27 ENCOUNTER — Encounter: Payer: Self-pay | Admitting: Family Medicine

## 2014-09-27 VITALS — BP 146/93 | HR 68 | Wt 295.0 lb

## 2014-09-27 DIAGNOSIS — I10 Essential (primary) hypertension: Secondary | ICD-10-CM

## 2014-09-27 DIAGNOSIS — T148 Other injury of unspecified body region: Secondary | ICD-10-CM | POA: Diagnosis not present

## 2014-09-27 DIAGNOSIS — W57XXXA Bitten or stung by nonvenomous insect and other nonvenomous arthropods, initial encounter: Secondary | ICD-10-CM

## 2014-09-27 DIAGNOSIS — F411 Generalized anxiety disorder: Secondary | ICD-10-CM

## 2014-09-27 MED ORDER — METOPROLOL SUCCINATE ER 100 MG PO TB24: 100.0000 mg | ORAL_TABLET | Freq: Every day | ORAL | Status: DC

## 2014-09-27 MED ORDER — DOXYCYCLINE HYCLATE 100 MG PO TABS: ORAL_TABLET | ORAL | Status: AC

## 2014-09-27 NOTE — Progress Notes (Signed)
CC: Eugene Coleman is a 44 y.o. male is here for Hypertension   Subjective: HPI:  Follow-up essential hypertension: He notices blood pressure was as high as 063 systolic at home so he decreased his metoprolol to 50 mg in the morning and 25 mg in the evening. He's noticed that this is brought his systolic pressures down to 140-150. He is uncertain about diastolics. He denies any chest pain shortness of breath orthopnea nor peripheral edema.  Follow-up anxiety: He's had to back down on Paxil because it was causing him to start not worry about gaining weight. He noticed that he was gaining weight around the abdomen slowly and steadily since 30 mg of this was provided daily. He decided to drop back down to 10 mg and is trying to get back in the habit of exercising most days of the week. He feels that 10 mg still providing him adequate reduction in anxiety without causing any laziness.  He had a tick bite on the left shin that happened 3 weeks ago. He tells me that one week after this he developed a red circular rash with a central clear spot. Since then he's had some diffuse mild joint pain but he's also been working generously and physical sense and wonders if it's just arthritis from overuse.   Review Of Systems Outlined In HPI  Past Medical History  Diagnosis Date  . Generalized anxiety disorder 11/06/2012    Past Surgical History  Procedure Laterality Date  . Knee surgery  [phone]  . Tonsillectomy     Family History  Problem Relation Age of Onset  . Breast cancer Mother   . Hypertension Mother     father  . Crohn's disease Mother   . Heart attack      grandfather  . Stroke      grandfather    History   Social History  . Marital Status: Married    Spouse Name: N/A  . Number of Children: N/A  . Years of Education: N/A   Occupational History  . Not on file.   Social History Main Topics  . Smoking status: Never Smoker   . Smokeless tobacco: Former Systems developer    Quit  date: 01/08/1999  . Alcohol Use: No  . Drug Use: No  . Sexual Activity: Yes   Other Topics Concern  . Not on file   Social History Narrative     Objective: BP 146/93 mmHg  Pulse 68  Wt 295 lb (133.811 kg)  General: Alert and Oriented, No Acute Distress HEENT: Pupils equal, round, reactive to light. Conjunctivae clear.  Moist mucous membranes Lungs: Clear to auscultation bilaterally, no wheezing/ronchi/rales.  Comfortable work of breathing. Good air movement. Cardiac: Regular rate and rhythm. Normal S1/S2.  No murmurs, rubs, nor gallops.   Extremities: No peripheral edema.  Strong peripheral pulses.  Mental Status: No depression, anxiety, nor agitation. Skin: Warm and dry. Single papule on the left shin without any surrounding rash  Assessment & Plan: Eugene Coleman was seen today for hypertension.  Diagnoses and all orders for this visit:  Essential hypertension, benign Orders: -     metoprolol succinate (TOPROL-XL) 100 MG 24 hr tablet; Take 1 tablet (100 mg total) by mouth daily. Take with or immediately following a meal.  Generalized anxiety disorder  Tick bite Orders: -     doxycycline (VIBRA-TABS) 100 MG tablet; One by mouth twice a day for ten days.   Essential hypertension: Uncontrolled chronic condition increasing metoprolol have asked him  to drop off a blood pressure diary in one week after checking blood pressures twice a day Tick bite: Moderate suspicion for Lyme's disease therefore start doxycycline. Generalized anxiety disorder: Controlled with 10 mg of Paxil daily   Return in about 3 months (around 12/28/2014).

## 2014-10-13 ENCOUNTER — Ambulatory Visit (INDEPENDENT_AMBULATORY_CARE_PROVIDER_SITE_OTHER): Payer: BLUE CROSS/BLUE SHIELD | Admitting: Family Medicine

## 2014-10-13 VITALS — BP 162/93 | HR 58 | Wt 291.0 lb

## 2014-10-13 DIAGNOSIS — E291 Testicular hypofunction: Secondary | ICD-10-CM | POA: Diagnosis not present

## 2014-10-13 MED ORDER — TESTOSTERONE CYPIONATE 200 MG/ML IM SOLN
300.0000 mg | Freq: Once | INTRAMUSCULAR | Status: AC
  Administered 2014-10-13: 300 mg via INTRAMUSCULAR

## 2014-10-13 NOTE — Progress Notes (Signed)
   Subjective:    Patient ID: Eugene Coleman, male    DOB: 1970/07/03, 44 y.o.   MRN: [id number]  HPI  Patient came into office today for testosterone injection. Denies chest pain, shortness of breath, headaches and problems associated with taking this medication. Patient states he has had no abnornal mood swings. Pt BP was elevated today, states he didn't take his BP Rx and has had 2 cups of coffee prior to appt this am.   Review of Systems     Objective:   Physical Exam        Assessment & Plan:  Patient tolerated injection in [place] well without complications. Patient advised to schedule his next injection for 3 weeks from today.

## 2014-11-03 ENCOUNTER — Ambulatory Visit (INDEPENDENT_AMBULATORY_CARE_PROVIDER_SITE_OTHER): Payer: BLUE CROSS/BLUE SHIELD | Admitting: Family Medicine

## 2014-11-03 VITALS — BP 140/93 | HR 59 | Wt 289.0 lb

## 2014-11-03 DIAGNOSIS — E291 Testicular hypofunction: Secondary | ICD-10-CM

## 2014-11-03 MED ORDER — TESTOSTERONE CYPIONATE 200 MG/ML IM SOLN
300.0000 mg | Freq: Once | INTRAMUSCULAR | Status: AC
  Administered 2014-11-03: 300 mg via INTRAMUSCULAR

## 2014-11-03 NOTE — Progress Notes (Signed)
   Subjective:    Patient ID: Eugene Coleman, male    DOB: [birth year], 44 y.o.   MRN: [id number]  HPI  JOSSIAH SMOAK is here for a testosterone injection. Denies chest pain, shortness of breath, headaches or mood changes.   Review of Systems     Objective:   Physical Exam        Assessment & Plan:  Patient tolerated injection well without complications. Patient advised to schedule next injection 21 days from today.

## 2014-11-24 ENCOUNTER — Ambulatory Visit (INDEPENDENT_AMBULATORY_CARE_PROVIDER_SITE_OTHER): Payer: BLUE CROSS/BLUE SHIELD | Admitting: Family Medicine

## 2014-11-24 VITALS — BP 138/85 | HR 52 | Resp 16 | Wt 289.0 lb

## 2014-11-24 DIAGNOSIS — E291 Testicular hypofunction: Secondary | ICD-10-CM | POA: Diagnosis not present

## 2014-11-24 MED ORDER — TESTOSTERONE CYPIONATE 200 MG/ML IM SOLN
300.0000 mg | INTRAMUSCULAR | Status: DC
  Administered 2014-11-24: 300 mg via INTRAMUSCULAR

## 2014-11-24 NOTE — Progress Notes (Signed)
   Subjective:    Patient ID: Eugene Coleman, male    DOB: 1970/06/30, 44 y.o.   MRN: [id number]  HPI Patient is here for a testosterone injection. He denies chest pain, shortness of breath, headaches and problems with medication or mood changes.      Review of Systems     Objective:   Physical Exam        Assessment & Plan:  Patient tolerated injection well without complications. Patient advised to schedule next injection in 21 days.

## 2014-12-15 ENCOUNTER — Ambulatory Visit: Payer: BLUE CROSS/BLUE SHIELD

## 2014-12-16 ENCOUNTER — Ambulatory Visit: Payer: BLUE CROSS/BLUE SHIELD

## 2014-12-29 ENCOUNTER — Ambulatory Visit: Payer: BLUE CROSS/BLUE SHIELD

## 2014-12-30 ENCOUNTER — Encounter: Payer: Self-pay | Admitting: Family Medicine

## 2014-12-30 ENCOUNTER — Ambulatory Visit (INDEPENDENT_AMBULATORY_CARE_PROVIDER_SITE_OTHER): Payer: BLUE CROSS/BLUE SHIELD | Admitting: Family Medicine

## 2014-12-30 DIAGNOSIS — T464X5A Adverse effect of angiotensin-converting-enzyme inhibitors, initial encounter: Secondary | ICD-10-CM | POA: Diagnosis not present

## 2014-12-30 DIAGNOSIS — E291 Testicular hypofunction: Secondary | ICD-10-CM | POA: Diagnosis not present

## 2014-12-30 MED ORDER — LISINOPRIL 5 MG PO TABS: 5.0000 mg | ORAL_TABLET | Freq: Every day | ORAL | Status: DC

## 2014-12-30 MED ORDER — TESTOSTERONE CYPIONATE 200 MG/ML IM SOLN
300.0000 mg | Freq: Once | INTRAMUSCULAR | Status: AC
  Administered 2014-12-30: 300 mg via INTRAMUSCULAR

## 2014-12-30 NOTE — Progress Notes (Signed)
Pt advised of Rx, verbalized understanding. Advised to keep his next follow up for testosterone injection but to call us if he has any questions in the mean time.

## 2014-12-30 NOTE — Progress Notes (Signed)
Starting low dose lisinopril.

## 2014-12-30 NOTE — Progress Notes (Signed)
Patient ID: Eugene Coleman, male   DOB: 12-May-1970, 44 y.o.   MRN: [id number]   Patient had No Complaints of Shortness of Breathe or Chest Pains.  Patient stated that he has stopped taking his BP Medicine because it was interfering with his exercise regimen & heart rate. He would like to try another BP med that wouldn't interfere with his heart rate because he is running again.  Patient has lost 7lbs  Patient tolerated injection well in [place]. And was making appointment  To return in 21 days

## 2015-01-20 ENCOUNTER — Encounter: Payer: Self-pay | Admitting: Family Medicine

## 2015-01-20 ENCOUNTER — Ambulatory Visit: Payer: BLUE CROSS/BLUE SHIELD

## 2015-01-20 ENCOUNTER — Ambulatory Visit (INDEPENDENT_AMBULATORY_CARE_PROVIDER_SITE_OTHER): Payer: BLUE CROSS/BLUE SHIELD | Admitting: Family Medicine

## 2015-01-20 VITALS — BP 147/95 | HR 74 | Wt 286.0 lb

## 2015-01-20 DIAGNOSIS — E291 Testicular hypofunction: Secondary | ICD-10-CM | POA: Diagnosis not present

## 2015-01-20 DIAGNOSIS — I10 Essential (primary) hypertension: Secondary | ICD-10-CM

## 2015-01-20 MED ORDER — TESTOSTERONE CYPIONATE 200 MG/ML IM SOLN
300.0000 mg | Freq: Once | INTRAMUSCULAR | Status: AC
  Administered 2015-01-20: 300 mg via INTRAMUSCULAR

## 2015-01-20 NOTE — Addendum Note (Signed)
Addended by: Delrae Alfred on: 01/20/2015 09:43 AM   Modules accepted: Orders

## 2015-01-20 NOTE — Progress Notes (Signed)
CC: Eugene Coleman is a 44 y.o. male is here for fertility questions   Subjective: HPI:  Follow-up essential hypertension: When taking lisinopril he felt that it was decreasing his motivation to exercise. If he exercises on a daily basis his blood pressure is always below 140/90. His adamant that he does not want to take any medication for blood pressure reduction. He denies chest pain shortness of breath orthopnea nor peripheral edema  Follow-up hypogonadism: He and his wife are trying to conceive, he is wondering if he should stop testosterone due to concerns that may cause birth defects. He tells me he does not take this medication on a three-week basis he has no libido and has difficulty initiating and maintaining an erection. He denies any known side effects from this medication.   Review Of Systems Outlined In HPI  Past Medical History  Diagnosis Date  . Generalized anxiety disorder 11/06/2012    Past Surgical History  Procedure Laterality Date  . Knee surgery  [phone]  . Tonsillectomy     Family History  Problem Relation Age of Onset  . Breast cancer Mother   . Hypertension Mother     father  . Crohn's disease Mother   . Heart attack      grandfather  . Stroke      grandfather    Social History   Social History  . Marital Status: Married    Spouse Name: N/A  . Number of Children: N/A  . Years of Education: N/A   Occupational History  . Not on file.   Social History Main Topics  . Smoking status: Never Smoker   . Smokeless tobacco: Former Systems developer    Quit date: 01/08/1999  . Alcohol Use: No  . Drug Use: No  . Sexual Activity: Yes   Other Topics Concern  . Not on file   Social History Narrative     Objective: BP 147/95 mmHg  Pulse 74  Wt 286 lb (129.729 kg)  Vital signs reviewed. General: Alert and Oriented, No Acute Distress HEENT: Pupils equal, round, reactive to light. Conjunctivae clear.  External ears unremarkable.  Moist mucous  membranes. Lungs: Clear and comfortable work of breathing, speaking in full sentences without accessory muscle use. Cardiac: Regular rate and rhythm.  Neuro: CN II-XII grossly intact, gait normal. Extremities: No peripheral edema.  Strong peripheral pulses.  Mental Status: No depression, anxiety, nor agitation. Logical though process. Skin: Warm and dry.  Assessment & Plan: Kamryn was seen today for fertility questions.  Diagnoses and all orders for this visit:  Essential hypertension, benign  Hypogonadism male  Essential hypertension: Uncontrolled chronic condition, offered alternative blood pressure medication other than lisinopril and metoprolol however his adamant he does not want to take medication for blood pressure control. Discussed sodium restriction, and daily exercise to help reduce blood pressure. Hypogonadism: We'll continue testosterone every 3 weeks, discussed that this medication could potentially reduce his sperm count however has not been associated with birth defects. He'll consider stopping this medication if he is unable to get his wife pregnant in the next 4 months.  Return in about 3 months (around 04/22/2015) for and every 3 week testosterone shots.

## 2015-02-09 ENCOUNTER — Ambulatory Visit (INDEPENDENT_AMBULATORY_CARE_PROVIDER_SITE_OTHER): Payer: BLUE CROSS/BLUE SHIELD | Admitting: Family Medicine

## 2015-02-09 VITALS — BP 149/93 | HR 66 | Wt 280.0 lb

## 2015-02-09 DIAGNOSIS — I10 Essential (primary) hypertension: Secondary | ICD-10-CM | POA: Diagnosis not present

## 2015-02-09 DIAGNOSIS — E291 Testicular hypofunction: Secondary | ICD-10-CM | POA: Diagnosis not present

## 2015-02-09 MED ORDER — TESTOSTERONE CYPIONATE 200 MG/ML IM SOLN
300.0000 mg | Freq: Once | INTRAMUSCULAR | Status: AC
  Administered 2015-02-09: 300 mg via INTRAMUSCULAR

## 2015-02-09 NOTE — Progress Notes (Signed)
Pt tolerated injection well with out any complaints.

## 2015-02-09 NOTE — Progress Notes (Signed)
Patient came in with elevated blood pressure today. He admitted had he has not been taking his blood pressure medication. Per his record from the next all pharmacy he is actually supposed to be taking lisinopril 5 mg and metoprolol 100 mg daily. His repeat blood pressure was slightly improved but still not at goal. Reminded patient that he needs to be taking his medications regularly and his blood pressure needs to be at goal for [place] to continue to do his testosterone injections.  Beatrice Lecher, MD

## 2015-03-02 ENCOUNTER — Ambulatory Visit: Payer: BLUE CROSS/BLUE SHIELD

## 2015-04-09 ENCOUNTER — Other Ambulatory Visit: Payer: Self-pay | Admitting: Family Medicine

## 2015-04-10 NOTE — Telephone Encounter (Signed)
Pt requesting refill of lisinopril 5 mg qd through his pharmacy.  This was an historical medication entry. He did stated that he increased his dose to 10 mg daily (1 tab in the am and 1 tab in the pm).  He reports that this seems to be working better for him.  Pt was advised to make an appointment to f/u with medications.

## 2015-04-11 NOTE — Telephone Encounter (Signed)
Agree with above, supply sent in to last until he can follow up.

## 2015-04-14 ENCOUNTER — Ambulatory Visit: Payer: BLUE CROSS/BLUE SHIELD | Admitting: Family Medicine

## 2015-06-22 ENCOUNTER — Other Ambulatory Visit: Payer: Self-pay | Admitting: Family Medicine

## 2015-07-09 ENCOUNTER — Other Ambulatory Visit: Payer: Self-pay | Admitting: Family Medicine

## 2015-07-21 ENCOUNTER — Other Ambulatory Visit: Payer: Self-pay | Admitting: Family Medicine

## 2015-07-25 ENCOUNTER — Encounter: Payer: Self-pay | Admitting: Family Medicine

## 2015-07-25 ENCOUNTER — Ambulatory Visit (INDEPENDENT_AMBULATORY_CARE_PROVIDER_SITE_OTHER): Payer: Self-pay | Admitting: Family Medicine

## 2015-07-25 VITALS — BP 132/86 | HR 67 | Wt 270.0 lb

## 2015-07-25 DIAGNOSIS — F411 Generalized anxiety disorder: Secondary | ICD-10-CM

## 2015-07-25 DIAGNOSIS — I10 Essential (primary) hypertension: Secondary | ICD-10-CM

## 2015-07-25 DIAGNOSIS — G4700 Insomnia, unspecified: Secondary | ICD-10-CM

## 2015-07-25 DIAGNOSIS — Z3009 Encounter for other general counseling and advice on contraception: Secondary | ICD-10-CM

## 2015-07-25 DIAGNOSIS — Z309 Encounter for contraceptive management, unspecified: Secondary | ICD-10-CM

## 2015-07-25 MED ORDER — LISINOPRIL 5 MG PO TABS: 5.0000 mg | ORAL_TABLET | Freq: Two times a day (BID) | ORAL | Status: DC

## 2015-07-25 MED ORDER — PAROXETINE HCL 20 MG PO TABS: 30.0000 mg | ORAL_TABLET | Freq: Every day | ORAL | Status: DC

## 2015-07-25 NOTE — Progress Notes (Signed)
CC: Eugene Coleman is a 45 y.o. male is here for Hypertension and Anxiety   Subjective: HPI:  Patient will like to get a vasectomy. He's been thinking about this for many months now both he and his wife have come to the conclusion that it would be in the family's best interest to get a vasectomy.  Follow-up essential hypertension: He is no longer taking metoprolol. He is taking lisinopril 5 mg twice a day. He denies any known side effects. Denies chest pain shortness breath orthopnea nor peripheral edema.  Follow-up anxiety: His taking 30 mg of Paxil on a daily basis without any known side effects. He tells me that his current dose makes him feel content without any anxiety. He denies any other mental disturbance. He denies any known side effects. The only bad part of this medication is that it has reduced his desire to run on a daily basis.  Follow-up insomnia: He is currently not taking anything to help with sleep. He believes that ever since the Paxil helped with anxiety he's not had any difficulties falling asleep or staying asleep.   Review Of Systems Outlined In HPI  Past Medical History  Diagnosis Date  . Generalized anxiety disorder 11/06/2012    Past Surgical History  Procedure Laterality Date  . Knee surgery  [phone]  . Tonsillectomy     Family History  Problem Relation Age of Onset  . Breast cancer Mother   . Hypertension Mother     father  . Crohn's disease Mother   . Heart attack      grandfather  . Stroke      grandfather    Social History   Social History  . Marital Status: Married    Spouse Name: N/A  . Number of Children: N/A  . Years of Education: N/A   Occupational History  . Not on file.   Social History Main Topics  . Smoking status: Never Smoker   . Smokeless tobacco: Former Systems developer    Quit date: 01/08/1999  . Alcohol Use: No  . Drug Use: No  . Sexual Activity: Yes   Other Topics Concern  . Not on file   Social History Narrative      Objective: BP 132/86 mmHg  Pulse 67  Wt 270 lb (122.471 kg)  General: Alert and Oriented, No Acute Distress HEENT: Pupils equal, round, reactive to light. Conjunctivae clear. Moist mucous membranes Lungs: Clear to auscultation bilaterally, no wheezing/ronchi/rales.  Comfortable work of breathing. Good air movement. Cardiac: Regular rate and rhythm. Normal S1/S2.  No murmurs, rubs, nor gallops.   Extremities: No peripheral edema.  Strong peripheral pulses.  Mental Status: No depression, anxiety, nor agitation. Skin: Warm and dry.  Assessment & Plan: Jabori was seen today for hypertension and anxiety.  Diagnoses and all orders for this visit:  Vasectomy evaluation -     Ambulatory referral to Urology  Essential hypertension, benign -     lisinopril (PRINIVIL,ZESTRIL) 5 MG tablet; Take 1 tablet (5 mg total) by mouth 2 (two) times daily.  Generalized anxiety disorder -     PARoxetine (PAXIL) 20 MG tablet; Take 1.5 tablets (30 mg total) by mouth daily.  Insomnia  Referral for vasectomy has been placed, and asked him to contact me if he is not notified by the end of next week Essential hypertension: Currently controlled with lisinopril 5 mg twice a day. Anxiety: Controlled with Paxil Insomnia: Resolved now that anxiety is up in the past.  Return in about 6 months (around 01/25/2016).

## 2015-08-20 ENCOUNTER — Other Ambulatory Visit: Payer: Self-pay | Admitting: Family Medicine

## 2016-01-25 ENCOUNTER — Ambulatory Visit: Payer: Self-pay | Admitting: Family Medicine

## 2017-04-02 ENCOUNTER — Emergency Department (HOSPITAL_COMMUNITY): Payer: Medicaid Other

## 2017-04-02 ENCOUNTER — Emergency Department (HOSPITAL_COMMUNITY)
Admission: EM | Admit: 2017-04-02 | Discharge: 2017-04-02 | Disposition: A | Discharge status: Home / Self Care | Payer: Medicaid Other | Attending: Emergency Medicine | Admitting: Emergency Medicine

## 2017-04-02 ENCOUNTER — Other Ambulatory Visit: Payer: Self-pay

## 2017-04-02 ENCOUNTER — Encounter (HOSPITAL_COMMUNITY): Payer: Self-pay | Admitting: Emergency Medicine

## 2017-04-02 DIAGNOSIS — R42 Dizziness and giddiness: Secondary | ICD-10-CM | POA: Insufficient documentation

## 2017-04-02 DIAGNOSIS — R1031 Right lower quadrant pain: Secondary | ICD-10-CM | POA: Diagnosis not present

## 2017-04-02 DIAGNOSIS — I10 Essential (primary) hypertension: Secondary | ICD-10-CM | POA: Diagnosis not present

## 2017-04-02 DIAGNOSIS — R2990 Unspecified symptoms and signs involving the nervous system: Secondary | ICD-10-CM

## 2017-04-02 DIAGNOSIS — Z79899 Other long term (current) drug therapy: Secondary | ICD-10-CM | POA: Diagnosis not present

## 2017-04-02 DIAGNOSIS — R200 Anesthesia of skin: Secondary | ICD-10-CM | POA: Diagnosis not present

## 2017-04-02 DIAGNOSIS — F419 Anxiety disorder, unspecified: Secondary | ICD-10-CM | POA: Diagnosis not present

## 2017-04-02 DIAGNOSIS — M6281 Muscle weakness (generalized): Secondary | ICD-10-CM | POA: Insufficient documentation

## 2017-04-02 LAB — PROTIME-INR
INR: 1.08
PROTHROMBIN TIME: 13.9 s (ref 11.4–15.2)

## 2017-04-02 LAB — URINALYSIS, ROUTINE W REFLEX MICROSCOPIC
BILIRUBIN URINE: NEGATIVE
Glucose, UA: NEGATIVE mg/dL
Hgb urine dipstick: NEGATIVE
KETONES UR: 80 mg/dL — AB
Leukocytes, UA: NEGATIVE
NITRITE: NEGATIVE
PROTEIN: NEGATIVE mg/dL
SPECIFIC GRAVITY, URINE: 1.034 — AB (ref 1.005–1.030)
pH: 5 (ref 5.0–8.0)

## 2017-04-02 LAB — I-STAT CHEM 8, ED
BUN: 14 mg/dL (ref 6–20)
CALCIUM ION: 1.16 mmol/L (ref 1.15–1.40)
CHLORIDE: 105 mmol/L (ref 101–111)
Creatinine, Ser: 0.8 mg/dL (ref 0.61–1.24)
Glucose, Bld: 97 mg/dL (ref 65–99)
HCT: 48 % (ref 39.0–52.0)
Hemoglobin: 16.3 g/dL (ref 13.0–17.0)
Potassium: 3.8 mmol/L (ref 3.5–5.1)
SODIUM: 140 mmol/L (ref 135–145)
TCO2: 22 mmol/L (ref 22–32)

## 2017-04-02 LAB — COMPREHENSIVE METABOLIC PANEL
ALT: 36 U/L (ref 17–63)
ANION GAP: 11 (ref 5–15)
AST: 24 U/L (ref 15–41)
Albumin: 4.2 g/dL (ref 3.5–5.0)
Alkaline Phosphatase: 70 U/L (ref 38–126)
BUN: 14 mg/dL (ref 6–20)
CHLORIDE: 106 mmol/L (ref 101–111)
CO2: 21 mmol/L — AB (ref 22–32)
Calcium: 9.4 mg/dL (ref 8.9–10.3)
Creatinine, Ser: 0.93 mg/dL (ref 0.61–1.24)
GFR calc non Af Amer: >60 mL/min (ref >60)
Glucose, Bld: 98 mg/dL (ref 65–99)
Potassium: 3.9 mmol/L (ref 3.5–5.1)
SODIUM: 138 mmol/L (ref 135–145)
Total Bilirubin: 1.3 mg/dL — ABNORMAL HIGH (ref 0.3–1.2)
Total Protein: 7.2 g/dL (ref 6.5–8.1)

## 2017-04-02 LAB — DIFFERENTIAL
BASOS PCT: 0 %
Basophils Absolute: 0 10*3/uL (ref 0.0–0.1)
EOS PCT: 0 %
Eosinophils Absolute: 0 10*3/uL (ref 0.0–0.7)
Lymphocytes Relative: 23 %
Lymphs Abs: 2.2 10*3/uL (ref 0.7–4.0)
Monocytes Absolute: 0.4 10*3/uL (ref 0.1–1.0)
Monocytes Relative: 5 %
Neutro Abs: 6.7 10*3/uL (ref 1.7–7.7)
Neutrophils Relative %: 72 %

## 2017-04-02 LAB — CBC
HCT: 46.6 % (ref 39.0–52.0)
Hemoglobin: 16.1 g/dL (ref 13.0–17.0)
MCH: 30.5 pg (ref 26.0–34.0)
MCHC: 34.5 g/dL (ref 30.0–36.0)
MCV: 88.3 fL (ref 78.0–100.0)
PLATELETS: 272 10*3/uL (ref 150–400)
RBC: 5.28 MIL/uL (ref 4.22–5.81)
RDW: 12.7 % (ref 11.5–15.5)
WBC: 9.3 10*3/uL (ref 4.0–10.5)

## 2017-04-02 LAB — I-STAT TROPONIN, ED: Troponin i, poc: 0 ng/mL (ref 0.00–0.08)

## 2017-04-02 LAB — LIPASE, BLOOD: LIPASE: 25 U/L (ref 11–51)

## 2017-04-02 LAB — APTT: aPTT: 32 seconds (ref 24–36)

## 2017-04-02 MED ORDER — SODIUM CHLORIDE 0.9 % IV BOLUS (SEPSIS)
500.0000 mL | Freq: Once | INTRAVENOUS | Status: AC
Start: 2017-04-02 — End: 2017-04-02
  Administered 2017-04-02: 500 mL via INTRAVENOUS

## 2017-04-02 MED ORDER — IOPAMIDOL (ISOVUE-300) INJECTION 61%
INTRAVENOUS | Status: AC
  Administered 2017-04-02: 100 mL
  Filled 2017-04-02: qty 100

## 2017-04-02 MED ORDER — SODIUM CHLORIDE 0.9 % IV SOLN: INTRAVENOUS | Status: DC

## 2017-04-02 MED ORDER — ONDANSETRON HCL 4 MG/2ML IJ SOLN
4.0000 mg | Freq: Once | INTRAMUSCULAR | Status: DC
  Filled 2017-04-02: qty 2

## 2017-04-02 MED ORDER — HYDROMORPHONE HCL 1 MG/ML IJ SOLN
0.5000 mg | Freq: Once | INTRAMUSCULAR | Status: DC
  Filled 2017-04-02: qty 1

## 2017-04-02 NOTE — ED Provider Notes (Signed)
[facility] EMER[facility]ENC[facility] DEPARTMENT Provider Note   CSN: [id number] Arrival date & time: 04/02/17  1050     History   Chief Complaint Chief Complaint  Patient presents with  . Stroke Symptoms    HPI Eugene Coleman is a 47 y.o. male.  Patient presenting with symptoms concerning for possible stroke.  Symptoms started yesterday morning.  With dizziness but no room spinning.  Numbness to right side of face.  And head.  And weakness to right side of the body.  Also associated with some right lower quadrant abdominal pain no nausea vomiting or diarrhea.  No similar symptoms in the past.  But the belly pain occasionally has had before.  But this is been stronger.  No history of stroke.  Past medical history significant for generalized anxiety disorder.  Not associated with chest pain or shortness of breath.      Past Medical History:  Diagnosis Date  . Generalized anxiety disorder 11/06/2012    Patient Active Problem List   Diagnosis Date Noted  . 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 Insomnia 12/09/2012  . GERD (gastroesophageal reflux disease) 12/09/2012  . Essential hypertension, benign 11/06/2012  . Generalized anxiety disorder 11/06/2012  . Right lower quadrant pain 01/08/2012    Past Surgical History:  Procedure Laterality Date  . KNEE SURGERY  [phone]  . TONSILLECTOMY         Home Medications    Prior to Admission medications   Medication Sig Start Date End Date Taking? Authorizing Provider  Calcium Carbonate Antacid (ANTACID PO) Take by mouth.    [provider]  lisinopril (PRINIVIL,ZESTRIL) 5 MG tablet Take 1 tablet (5 mg total) by mouth 2 (two) times daily. 07/25/15   Hommel, Hilliard Clark, DO  lisinopril (PRINIVIL,ZESTRIL) 5 MG tablet TAKE 1 TABLET (5 MG TOTAL) BY MOUTH 2 (TWO) TIMES DAILY. NEED FOLLOW UP APPOINTMENT FOR MORE REFILLS 08/21/15   Marcial Pacas, DO  metoprolol succinate (TOPROL-XL) 100 MG 24 hr tablet Take 1 tablet by mouth  daily. 11/14/14   [provider]  PARoxetine (PAXIL) 20 MG tablet Take 1.5 tablets (30 mg total) by mouth daily. 07/25/15   Marcial Pacas, DO  testosterone cypionate (DEPO-TESTOSTERONE) 200 MG/ML injection 300mg  IM every three weeks in office. 07/20/14   Marcial Pacas, DO    Family History Family History  Problem Relation Age of Onset  . Breast cancer Mother   . Hypertension Mother        father  . Crohn's disease Mother   . Heart attack Unknown        grandfather  . Stroke Unknown        grandfather    Social History Social History   Tobacco Use  . Smoking status: Never Smoker  . Smokeless tobacco: Former Network engineer Use Topics  . Alcohol use: No  . Drug use: No     Allergies   Penicillins   Review of Systems Review of Systems  Constitutional: Negative for fever.  HENT: Negative for congestion.   Eyes: Negative for visual disturbance.  Respiratory: Negative for shortness of breath.   Cardiovascular: Negative for chest pain.  Gastrointestinal: Positive for abdominal pain.  Genitourinary: Negative for dysuria.  Musculoskeletal: Negative for back pain and neck pain.  Skin: Negative for rash.  Neurological: Positive for dizziness, weakness and numbness. Negative for facial asymmetry and headaches.  Hematological: Does not bruise/bleed easily.  Psychiatric/Behavioral: Negative for confusion.     Physical Exam Updated Vital Signs  BP (!) 146/88 (BP Location: Right Arm)   Pulse 74   Temp 98.6 F (37 C) (Oral)   Resp 16   SpO2 96%   Physical Exam  Constitutional: He is oriented to person, place, and time. He appears well-developed and well-nourished. No distress.  HENT:  Head: Normocephalic and atraumatic.  Mouth/Throat: Oropharynx is clear and moist.  Eyes: Conjunctivae and EOM are normal. Pupils are equal, round, and reactive to light.  Neck: Normal range of motion. Neck supple.  Cardiovascular: Normal rate and regular rhythm.  Pulmonary/Chest:  Effort normal and breath sounds normal. No respiratory distress.  Abdominal: Soft. Bowel sounds are normal. There is tenderness.  Tenderness right lower quadrant without guarding  Musculoskeletal: Normal range of motion. He exhibits no edema.  Neurological: He is alert and oriented to person, place, and time. No cranial nerve deficit or sensory deficit. He exhibits normal muscle tone. Coordination normal.  Skin: Skin is warm.  Nursing note and vitals reviewed.    ED Treatments / Results  Labs (all labs ordered are listed, but only abnormal results are displayed) Labs Reviewed  COMPREHENSIVE METABOLIC PANEL - Abnormal; Notable for the following components:      Result Value   CO2 21 (*)    Total Bilirubin 1.3 (*)    All other components within normal limits  PROTIME-INR  APTT  CBC  DIFFERENTIAL  LIPASE, BLOOD  URINALYSIS, ROUTINE W REFLEX MICROSCOPIC  I-STAT TROPONIN, ED  CBG MONITORING, ED  I-STAT CHEM 8, ED    EKG  EKG Interpretation  Date/Time:  Wednesday April 02 2017 10:54:47 EST Ventricular Rate:  80 PR Interval:  148 QRS Duration: 88 QT Interval:  380 QTC Calculation: 438 R Axis:   37 Text Interpretation:  Normal sinus rhythm Normal ECG Confirmed by Fredia Sorrow [phone]) on 04/02/2017 12:32:54 PM       Radiology Ct Head Wo Contrast  Result Date: 04/02/2017 CLINICAL DATA:  Right-sided weakness and facial numbness for 2 days, initial encounter EXAM: CT HEAD WITHOUT CONTRAST TECHNIQUE: Contiguous axial images were obtained from the base of the skull through the vertex without intravenous contrast. COMPARISON:  None. FINDINGS: Brain: No evidence of acute infarction, hemorrhage, hydrocephalus, extra-axial collection or mass lesion/mass effect. Vascular: No hyperdense vessel or unexpected calcification. Skull: Normal. Negative for fracture or focal lesion. Sinuses/Orbits: No acute finding. Other: None. IMPRESSION: No acute intracranial abnormality noted.  Electronically Signed   By: Inez Catalina M.D.   On: 04/02/2017 11:30   Mr Brain Wo Contrast (neuro Protocol)  Result Date: 04/02/2017 CLINICAL DATA:  Focal neuro deficit greater than 6 hours. Right-sided weakness and facial numbness EXAM: MRI HEAD WITHOUT CONTRAST TECHNIQUE: Multiplanar, multiecho pulse sequences of the brain and surrounding structures were obtained without intravenous contrast. COMPARISON:  CT head 04/02/2017 FINDINGS: Brain: No acute infarction, hemorrhage, hydrocephalus, extra-axial collection or mass lesion. Vascular: Normal arterial flow voids Skull and upper cervical spine: Negative Sinuses/Orbits: Negative Other: None IMPRESSION: Negative MRI head Electronically Signed   By: Franchot Gallo M.D.   On: 04/02/2017 14:30   Ct Abdomen Pelvis W Contrast  Result Date: 04/02/2017 CLINICAL DATA:  Abdominal pain EXAM: CT ABDOMEN AND PELVIS WITH CONTRAST TECHNIQUE: Multidetector CT imaging of the abdomen and pelvis was performed using the standard protocol following bolus administration of intravenous contrast. CONTRAST:  167mL ISOVUE-300 IOPAMIDOL (ISOVUE-300) INJECTION 61% COMPARISON:  None. FINDINGS: Lower chest: No pulmonary nodules or pleural effusion. No visible pericardial effusion. Hepatobiliary: Normal hepatic contours and density. No visible  biliary dilatation. Hepatic cyst in the right hepatic lobe measures 2.5 cm. Normal gallbladder. Pancreas: Normal contours without ductal dilatation. No peripancreatic fluid collection. Spleen: Normal. Adrenals/Urinary Tract: --Adrenal glands: Normal. --Right kidney/ureter: No hydronephrosis or perinephric stranding. No nephrolithiasis. No obstructing ureteral stones. --Left kidney/ureter: No hydronephrosis or perinephric stranding. No nephrolithiasis. No obstructing ureteral stones. --Urinary bladder: Unremarkable. Stomach/Bowel: --Stomach/Duodenum: No hiatal hernia or other gastric abnormality. Normal duodenal course and caliber. --Small bowel: No  dilatation or inflammation. --Colon: No focal abnormality. --Appendix: Normal. Vascular/Lymphatic: Normal course and caliber of the major abdominal vessels. No abdominal or pelvic lymphadenopathy. Reproductive: Normal prostate and seminal vesicles. Musculoskeletal. No bony spinal canal stenosis or focal osseous abnormality. Other: None. IMPRESSION: No acute abdominopelvic abnormality. Electronically Signed   By: Ulyses Jarred M.D.   On: 04/02/2017 14:59    Procedures Procedures (including critical care time)  Medications Ordered in ED Medications  0.9 %  sodium chloride infusion (not administered)  ondansetron (ZOFRAN) injection 4 mg (not administered)  HYDROmorphone (DILAUDID) injection 0.5 mg (not administered)  sodium chloride 0.9 % bolus 500 mL (0 mLs Intravenous Stopped 04/02/17 1323)  iopamidol (ISOVUE-300) 61 % injection (100 mLs  Contrast Given 04/02/17 1439)     Initial Impression / Assessment and Plan / ED Course  I have reviewed the triage vital signs and the nursing notes.  Pertinent labs & imaging results that were available during my care of the patient were reviewed by me and considered in my medical decision making (see chart for details).     Patient symptoms concerning for possible stroke with the dizziness not vertigo right facial numbness and weakness on the right side CT head and MRI brain without evidence of any acute abnormalities.  Also right lower quadrant abdominal pain CT of the abdomen pelvis without any acute abnormalities.  Patient's labs were normal with exception of slight elevation in bilirubin at 1.3.  But CT of abdomen gives no reason for that.  Patient stable for discharge home.  Is been ambulating here without any further dizziness.  Patient's past medical history is noncontributory.  Patient nontoxic no acute distress.  Final Clinical Impressions(s) / ED Diagnoses   Final diagnoses:  Stroke-like symptoms  Dizziness  Right lower quadrant abdominal pain     ED Discharge Orders    None       Fredia Sorrow, MD 04/02/17 1533

## 2017-04-02 NOTE — Discharge Instructions (Signed)
Workup without any acute findings.  CT head negative.  MRI brain without any evidence of stroke.  Also CT scan of abdomen without any acute abnormalities.  Return for any new or worse symptoms.  Recommend taking it easy for a couple days.

## 2017-04-02 NOTE — ED Triage Notes (Signed)
Pt also reports left chest sharp stabbing pain, intermittent.

## 2017-04-02 NOTE — ED Triage Notes (Signed)
Pt to ER for right sided weakness and facial numbness onset night of 1/21 while in bed. No drift or weakness noted. Pt a/o x4. VSS. Pt reports feeling heavy on right side. Reports dizziness when standing.

## 2017-04-22 ENCOUNTER — Ambulatory Visit: Payer: Self-pay | Admitting: Family Medicine

## 2017-04-22 NOTE — Progress Notes (Deleted)
Subjective:    Patient ID: Eugene Coleman, male    DOB: [birth year], 47 y.o.   MRN: [id number]  HPI  47 year old male comes in today to reestablish care.  He was previously seen by Dr. Marcial Pacas.  He was also recently seen in the emergency department on January 24 for strokelike symptoms.  He presented with an episode of dizziness and right-sided facial numbness.  He also had some right lower quadrant pain at the time.  He had a head CT as well as head MRI which ruled out any acute lesions such as infarction.  He also had an abdominal CT which was essentially negative or not finding any specific or worrisome cause for the right lower quadrant pain.  He was discharged home, without being admitted.  Kidney and liver function were normal.  Pink otitis was ruled out.  Urinary tract infection was ruled out.  Really the only thing mildly abnormal was just a slightly elevated bilirubin.    Review of Systems   There were no vitals taken for this visit.    Allergies  Allergen Reactions  . Penicillins Rash    Was allergic to it as a child    Past Medical History:  Diagnosis Date  . Generalized anxiety disorder 11/06/2012    Past Surgical History:  Procedure Laterality Date  . KNEE SURGERY  [phone]  . TONSILLECTOMY      Social History   Socioeconomic History  . Marital status: Married    Spouse name: Not on file  . Number of children: Not on file  . Years of education: Not on file  . Highest education level: Not on file  Social Needs  . Financial resource strain: Not on file  . Food insecurity - worry: Not on file  . Food insecurity - inability: Not on file  . Transportation needs - medical: Not on file  . Transportation needs - non-medical: Not on file  Occupational History  . Not on file  Tobacco Use  . Smoking status: Never Smoker  . Smokeless tobacco: Former Network engineer and Sexual Activity  . Alcohol use: No  . Drug use: No  . Sexual activity: Yes  Other  Topics Concern  . Not on file  Social History Narrative  . Not on file    Family History  Problem Relation Age of Onset  . Breast cancer Mother   . Hypertension Mother        father  . Crohn's disease Mother   . Heart attack Unknown        grandfather  . Stroke Unknown        grandfather    Outpatient Encounter Medications as of 04/22/2017  Medication Sig  . Calcium Carbonate Antacid (ANTACID PO) Take by mouth.  Marland Kitchen lisinopril (PRINIVIL,ZESTRIL) 5 MG tablet Take 1 tablet (5 mg total) by mouth 2 (two) times daily.  Marland Kitchen lisinopril (PRINIVIL,ZESTRIL) 5 MG tablet TAKE 1 TABLET (5 MG TOTAL) BY MOUTH 2 (TWO) TIMES DAILY. NEED FOLLOW UP APPOINTMENT FOR MORE REFILLS  . metoprolol succinate (TOPROL-XL) 100 MG 24 hr tablet Take 1 tablet by mouth daily.  Marland Kitchen PARoxetine (PAXIL) 20 MG tablet Take 1.5 tablets (30 mg total) by mouth daily.  Marland Kitchen testosterone cypionate (DEPO-TESTOSTERONE) 200 MG/ML injection 300mg  IM every three weeks in office.   No facility-administered encounter medications on file as of 04/22/2017.          Objective:   Physical Exam  Constitutional: He is oriented  to person, place, and time. He appears well-developed and well-nourished.  HENT:  Head: Normocephalic and atraumatic.  Cardiovascular: Normal rate, regular rhythm and normal heart sounds.  Pulmonary/Chest: Effort normal and breath sounds normal.  Abdominal: Soft. Bowel sounds are normal. He exhibits no distension and no mass. There is no tenderness. There is no rebound and no guarding.  Neurological: He is alert and oriented to person, place, and time.  Skin: Skin is warm and dry.  Psychiatric: He has a normal mood and affect. His behavior is normal.          Assessment & Plan:

## 2017-04-25 ENCOUNTER — Encounter: Payer: Self-pay | Admitting: Gastroenterology

## 2017-04-25 ENCOUNTER — Ambulatory Visit (INDEPENDENT_AMBULATORY_CARE_PROVIDER_SITE_OTHER): Payer: Self-pay | Admitting: Physician Assistant

## 2017-04-25 ENCOUNTER — Encounter: Payer: Self-pay | Admitting: Physician Assistant

## 2017-04-25 VITALS — BP 122/73 | HR 65 | Temp 97.6°F | Resp 16 | Wt 263.8 lb

## 2017-04-25 DIAGNOSIS — Z7689 Persons encountering health services in other specified circumstances: Secondary | ICD-10-CM

## 2017-04-25 DIAGNOSIS — R5383 Other fatigue: Secondary | ICD-10-CM | POA: Insufficient documentation

## 2017-04-25 DIAGNOSIS — R824 Acetonuria: Secondary | ICD-10-CM | POA: Insufficient documentation

## 2017-04-25 DIAGNOSIS — R21 Rash and other nonspecific skin eruption: Secondary | ICD-10-CM | POA: Insufficient documentation

## 2017-04-25 DIAGNOSIS — Z832 Family history of diseases of the blood and blood-forming organs and certain disorders involving the immune mechanism: Secondary | ICD-10-CM

## 2017-04-25 DIAGNOSIS — R109 Unspecified abdominal pain: Secondary | ICD-10-CM

## 2017-04-25 DIAGNOSIS — Z8379 Family history of other diseases of the digestive system: Secondary | ICD-10-CM | POA: Insufficient documentation

## 2017-04-25 MED ORDER — HYDROXYZINE HCL 25 MG PO TABS
25.0000 mg | ORAL_TABLET | Freq: Three times a day (TID) | ORAL | 0 refills | Status: DC | PRN
Start: 2017-04-25 — End: 2017-05-28

## 2017-04-25 MED ORDER — FAMOTIDINE 20 MG PO TABS: 20.0000 mg | ORAL_TABLET | Freq: Two times a day (BID) | ORAL | 0 refills | Status: DC

## 2017-04-25 NOTE — Progress Notes (Signed)
HPI:                                                                Eugene Coleman is a 47 y.o. male who presents to [facility] today to establish care  Current concerns: rash and and abdominal pain  Patient reports "serious gut issues" gradually worsening. Complains right-sided waxing and waning abdominal pain for the last 3 months. He states it began after self-discontinuing his Zegerid. Describes pain as dull. Reports pain is worse when he does not eat. Resuming Zegerid has helped. He has tried discontinuing all caffeine (was drinking 6-7 cups per day) and he also self-discontinued all of his chronic medications including Paxil and Testosterone. He also endorses chills, fatigue and loose stools. Denies fever, weight loss, nausea/vomiting, urgency, tenesmus, hematochezia. He thinks his mom may have IBD, but is not sure. He has never had a colonoscopy. He had a CT abd/pelvis on 04/02/17, which was unremarkable except for hepatic cyst 2.5 cm.    He also reports 2 days of an extremely pruritic disseminated rash on his extremities and trunk, sparing the face, hands and feet. States it is worse with heat and taking a shower. Denies fever, joint swelling/pain.  He is concerned that he may have an autoimmune disease. He is particularly concerned about his adrenal glands. Sister has lupus. Mother may have IBD. Cousins have thyroid disease.  Depression screen [facility] 2/9 04/25/2017  Decreased Interest 0  Down, Depressed, Hopeless 0  PHQ - 2 Score 0  Tired, decreased energy 0  Change in appetite 0  Feeling bad or failure about yourself  0  Trouble concentrating 0  Moving slowly or fidgety/restless 0  Suicidal thoughts 0    No flowsheet data found.    Past Medical History:  Diagnosis Date  . Generalized anxiety disorder 11/06/2012  . Hypertension    Past Surgical History:  Procedure Laterality Date  . KNEE SURGERY  [phone]  .  TONSILLECTOMY     Social History   Tobacco Use  . Smoking status: Never Smoker  . Smokeless tobacco: Former Network engineer Use Topics  . Alcohol use: No   family history includes Breast cancer in his mother; Heart attack in his unknown relative; Hypertension in his mother; Inflammatory bowel disease in his mother; Leukemia in his father; Lupus in his sister; Stroke in his unknown relative; Thyroid disease in his cousin.    ROS: negative except as noted in the HPI  Medications: Current Outpatient Medications  Medication Sig Dispense Refill  . omeprazole-sodium bicarbonate (ZEGERID) 40-1100 MG capsule Take 1 capsule by mouth daily before breakfast.    . famotidine (PEPCID) 20 MG tablet Take 1 tablet (20 mg total) by mouth 2 (two) times daily. 30 tablet 0  . hydrOXYzine (ATARAX/VISTARIL) 25 MG tablet Take 1 tablet (25 mg total) by mouth 3 (three) times daily as needed for itching (will cause drowsiness). 30 tablet 0   No current facility-administered medications for this visit.    Allergies  Allergen Reactions  . Penicillins Rash    Was allergic to it as a child       Objective:  BP 122/73 (BP Location: Left Arm, Patient Position: Sitting, Cuff Size: Large)   Pulse  65   Temp 97.6 F (36.4 C) (Oral)   Resp 16   Wt 263 lb 12.8 oz (119.7 kg)   SpO2 97%   BMI 32.97 kg/m  Gen:  alert, not ill-appearing, no distress, appropriate for age, obese male HEENT: head normocephalic without obvious abnormality, conjunctiva and cornea clear, trachea midline Pulm: Normal work of breathing, normal phonation, clear to auscultation bilaterally, no wheezes, rales or rhonchi CV: Normal rate, regular rhythm, s1 and s2 distinct, no murmurs, clicks or rubs  GI: abdomen obese, soft, there is tenderness of the periumbilical area, no rebound or guarding or rigidity, negative Murphy's sign Neuro: alert and oriented x 3, no tremor MSK: extremities atraumatic, no joint pain/edema, normal gait and  station Skin: disseminated maculopapular erythematous rash of the upper extremities bilaterally, worse in the proximal biceps area, as well as the trunk and anterior distal lower extremities, rash is nonblanchable Psych: well-groomed, cooperative, good eye contact, euthymic mood, affect mood-congruent, speech is articulate, and thought processes clear and goal-directed  Lab Results  Component Value Date   CREATININE 0.80 04/02/2017   BUN 14 04/02/2017   NA 140 04/02/2017   K 3.8 04/02/2017   CL 105 04/02/2017   CO2 21 (L) 04/02/2017   Lab Results  Component Value Date   ALT 36 04/02/2017   AST 24 04/02/2017   ALKPHOS 70 04/02/2017   BILITOT 1.3 (H) 04/02/2017    Lab Results  Component Value Date   WBC 9.3 04/02/2017   HGB 16.3 04/02/2017   HCT 48.0 04/02/2017   MCV 88.3 04/02/2017   PLT 272 04/02/2017   Lab Results  Component Value Date   LIPASE 25 04/02/2017     No results found for this or any previous visit (from the past 72 hour(s)). No results found.    Assessment and Plan: 47 y.o. male with   1. Encounter to establish care - reviewed PMH, PSH, PFH, medications and allergies - reviewed health maintenance - declines influenza and Tdap - neg PHQ2  2. Rash and nonspecific skin eruption - working up for autoimmune disorder. He is concerned about his adrenals, so we will check ACTH and cortisol. I explained adrenal insufficiency is very unlikely related to caffeine withdrawal. He has no symptoms of Graves disease. He declines steroid taper for his rash due to concern about adrenals. H2RA and antihistamine for possible allergic reaction/pruritus. We are also holding his Lisinopril to rule out drug eruption. - ANA - CBC with Differential/Platelet - Comprehensive metabolic panel - Sedimentation rate - Rheumatoid Arthritis Diagnostic Panel - TSH + free T4 - ACTH - Cortisol, free, Serum - HIV antibody (with reflex) - hydrOXYzine (ATARAX/VISTARIL) 25 MG tablet;  Take 1 tablet (25 mg total) by mouth 3 (three) times daily as needed for itching (will cause drowsiness).  Dispense: 30 tablet; Refill: 0 - famotidine (PEPCID) 20 MG tablet; Take 1 tablet (20 mg total) by mouth 2 (two) times daily.  Dispense: 30 tablet; Refill: 0  3. Right sided abdominal pain - negative CT Abd/Pelvis weeks ago is reassuring. Symptoms have improved with resuming Zegerid. History of NSAID use. Recommend 4 weeks of PPI therapy to treat any PUD/gastritis. Since there is a recurring issue, and there is possible family hx of IBD, will refer to GI. No red flag symptoms. - personally reviewed labs from 04/02/17. CBC, CMP, PT/INR unremarkable. Mild elevation of T. bili - Comprehensive metabolic panel - Urinalysis, Routine w reflex microscopic - Ambulatory referral to Gastroenterology  4. Fatigue, unspecified  type - ANA - CBC with Differential/Platelet - Comprehensive metabolic panel - Sedimentation rate - Rheumatoid Arthritis Diagnostic Panel - TSH + free T4 - ACTH - Cortisol, free, Serum - Urinalysis, Routine w reflex microscopic - HIV antibody (with reflex)  Ketonuria - Urinalysis, Routine w reflex microscopic  5. Family history of autoimmune disorder - ANA - Rheumatoid Arthritis Diagnostic Panel - TSH + free T4  6. Family history of inflammatory bowel disease - Ambulatory referral to Gastroenterology   Patient education and anticipatory guidance given Patient agrees with treatment plan Follow-up in 1 week for rash or sooner as needed if symptoms worsen or fail to improve  Darlyne Russian PA-C

## 2017-04-25 NOTE — Patient Instructions (Addendum)
-   stop Lisinopril and monitor your blood pressure - hydroxyzine and pepcid for rash

## 2017-04-29 ENCOUNTER — Encounter: Payer: Self-pay | Admitting: Physician Assistant

## 2017-04-29 DIAGNOSIS — R7681 Abnormal rheumatoid factor and anti-citrullinated protein antibody without rheumatoid arthritis: Secondary | ICD-10-CM | POA: Insufficient documentation

## 2017-04-29 DIAGNOSIS — R768 Other specified abnormal immunological findings in serum: Secondary | ICD-10-CM

## 2017-04-29 HISTORY — DX: Other specified abnormal immunological findings in serum: R76.8

## 2017-04-29 NOTE — Progress Notes (Signed)
Good morning Eugene Coleman,  Your labs look great overall - normal adrenal hormone - normal thyroid hormone - normal blood counts - normal inflammatory marker - normal lupus antibody  Your rheumatoid factor was slightly positive with a negative CCP antibody. This is not likely rheumatoid arthritis. Possible causes are going to be - infection - another autoimmune disease - false positive (positive in 5% of healthy people) - very early rheumatoid arthritis  I recommend we recheck your labs in 3 months to see if anything is trending up. This will confirm whether we are dealing with potential autoimmune disease or just a false positive.  Best, Evlyn Clines

## 2017-04-30 LAB — CBC WITH DIFFERENTIAL/PLATELET
BASOS ABS: 31 {cells}/uL (ref 0–200)
Basophils Relative: 0.6 %
EOS ABS: 52 {cells}/uL (ref 15–500)
Eosinophils Relative: 1 %
HEMATOCRIT: 43.9 % (ref 38.5–50.0)
Hemoglobin: 15 g/dL (ref 13.2–17.1)
Lymphs Abs: 1290 cells/uL (ref 850–3900)
MCH: 30.1 pg (ref 27.0–33.0)
MCHC: 34.2 g/dL (ref 32.0–36.0)
MCV: 88.2 fL (ref 80.0–100.0)
MPV: 12.5 fL (ref 7.5–12.5)
Monocytes Relative: 7.2 %
NEUTROS PCT: 66.4 %
Neutro Abs: 3453 cells/uL (ref 1500–7800)
PLATELETS: 220 10*3/uL (ref 140–400)
RBC: 4.98 10*6/uL (ref 4.20–5.80)
RDW: 12.9 % (ref 11.0–15.0)
Total Lymphocyte: 24.8 %
WBC: 5.2 10*3/uL (ref 3.8–10.8)
WBCMIX: 374 {cells}/uL (ref 200–950)

## 2017-04-30 LAB — URINALYSIS, ROUTINE W REFLEX MICROSCOPIC
Bilirubin Urine: NEGATIVE
GLUCOSE, UA: NEGATIVE
HGB URINE DIPSTICK: NEGATIVE
Ketones, ur: NEGATIVE
LEUKOCYTES UA: NEGATIVE
Nitrite: NEGATIVE
PROTEIN: NEGATIVE
Specific Gravity, Urine: 1.014 (ref 1.001–1.03)
pH: >=8.5 — AB (ref 5.0–8.0)

## 2017-04-30 LAB — CORTISOL, FREE: Cortisol Free, Ser: 0.77 ug/dL

## 2017-04-30 LAB — COMPREHENSIVE METABOLIC PANEL
AG Ratio: 2.1 (calc) (ref 1.0–2.5)
ALKALINE PHOSPHATASE (APISO): 54 U/L (ref 40–115)
ALT: 24 U/L (ref 9–46)
AST: 16 U/L (ref 10–40)
Albumin: 4.4 g/dL (ref 3.6–5.1)
BILIRUBIN TOTAL: 0.8 mg/dL (ref 0.2–1.2)
BUN: 16 mg/dL (ref 7–25)
CALCIUM: 9.7 mg/dL (ref 8.6–10.3)
CHLORIDE: 106 mmol/L (ref 98–110)
CO2: 28 mmol/L (ref 20–32)
CREATININE: 0.88 mg/dL (ref 0.60–1.35)
Globulin: 2.1 g/dL (calc) (ref 1.9–3.7)
Glucose, Bld: 99 mg/dL (ref 65–99)
Potassium: 4.6 mmol/L (ref 3.5–5.3)
Sodium: 140 mmol/L (ref 135–146)
Total Protein: 6.5 g/dL (ref 6.1–8.1)

## 2017-04-30 LAB — ANA: Anti Nuclear Antibody(ANA): NEGATIVE

## 2017-04-30 LAB — ACTH: C206 ACTH: 21 pg/mL (ref 6–50)

## 2017-04-30 LAB — RHEUMATOID ARTHRITIS DIAGNOSTIC PANEL: Rhuematoid fact SerPl-aCnc: 22 IU/mL — ABNORMAL HIGH (ref <14)

## 2017-04-30 LAB — HIV ANTIBODY (ROUTINE TESTING W REFLEX): HIV: NONREACTIVE

## 2017-04-30 LAB — SEDIMENTATION RATE: Sed Rate: 2 mm/h (ref 0–15)

## 2017-04-30 LAB — TSH+FREE T4: TSH W/REFLEX TO FT4: 0.5 m[IU]/L (ref 0.40–4.50)

## 2017-05-02 ENCOUNTER — Ambulatory Visit: Payer: No Typology Code available for payment source | Admitting: Physician Assistant

## 2017-05-02 DIAGNOSIS — Z0189 Encounter for other specified special examinations: Secondary | ICD-10-CM

## 2017-05-07 ENCOUNTER — Ambulatory Visit: Payer: No Typology Code available for payment source | Admitting: Gastroenterology

## 2017-05-12 ENCOUNTER — Encounter: Payer: Self-pay | Admitting: Physician Assistant

## 2017-05-14 ENCOUNTER — Encounter: Payer: Self-pay | Admitting: Physician Assistant

## 2017-05-28 ENCOUNTER — Ambulatory Visit (INDEPENDENT_AMBULATORY_CARE_PROVIDER_SITE_OTHER): Payer: No Typology Code available for payment source | Admitting: Physician Assistant

## 2017-05-28 ENCOUNTER — Encounter: Payer: Self-pay | Admitting: Physician Assistant

## 2017-05-28 VITALS — BP 160/92 | HR 56 | Ht 75.0 in | Wt 263.8 lb

## 2017-05-28 DIAGNOSIS — K6389 Other specified diseases of intestine: Secondary | ICD-10-CM

## 2017-05-28 DIAGNOSIS — K580 Irritable bowel syndrome with diarrhea: Secondary | ICD-10-CM

## 2017-05-28 DIAGNOSIS — R1084 Generalized abdominal pain: Secondary | ICD-10-CM | POA: Diagnosis not present

## 2017-05-28 MED ORDER — AMOXICILLIN-POT CLAVULANATE 875-125 MG PO TABS: 1.0000 | ORAL_TABLET | Freq: Two times a day (BID) | ORAL | 0 refills | Status: AC

## 2017-05-28 MED ORDER — RIFAXIMIN 550 MG PO TABS: 550.0000 mg | ORAL_TABLET | Freq: Three times a day (TID) | ORAL | 0 refills | Status: DC

## 2017-05-28 NOTE — Progress Notes (Signed)
Reviewed and agree with initial management plan.  Kayleanna Lorman T. Zerick Prevette, MD FACG 

## 2017-05-28 NOTE — Progress Notes (Signed)
Chief Complaint: Abdominal pain, bloating, change in bowels  HPI:    Eugene Coleman is a 47 year old male with a past medical history as listed below, including generalized anxiety disorder, who was referred to me by Eugene Coleman* for a complaint of abdominal pain, bloating and change in bowel habits.      CT abdomen pelvis 04/02/17 with no acute abdomen but no pelvic abnormality.  Labs 04/25/17 including sed rate, CMP and CBC, normal.  Lipase also normal 04/02/17.    Today, presents to clinic and explains that around Thanksgiving of last year he started with terrible abdominal cramping pain as well as loose bowel movements.  This was while he was drinking at least 3 cups of coffee and on Zegerid 20 mg daily.  Around Christmas explains that he was to the point at night where he would just have to go to his room will be in the fetal position due to discomfort.  Patient started to read about bacterial overgrowth and came off of his Zegerid.  This seemed to make his symptoms worse.  He then quit coffee which seemed to worsen his symptoms even more.  Describes a cognitive haziness in the afternoon which developed.  He then came off of his Prilosec and Zegerid about 2-3 weeks ago and has been doing the low FODMAP diet and all of his symptoms seem better.  Also started sublingual vitamins including B12 and D3 which have helped with his energy.  Patient notes that if he eats anything starchy or sugary all of his symptoms will come back and typically start in the right side of his abdomen and will radiate down to his lower abdomen over a 24-hour time period.  If he does not eat anything for 24 hours the symptoms will completely go away and do not come back unless he eats something starchy or sugary.  He is convinced that he has SIBO.    Interestingly also tells me he was on a low-dose antidepressant and stopped this at the end of summer last year.  Prior to symptom onset.    Social history positive for  having 6 kids under the age of 81 in his house.    Denies fever, chills, weight loss, anorexia, nausea, vomiting, heartburn, reflux or symptoms that awaken him at night.  Past Medical History:  Diagnosis Date  . Generalized anxiety disorder 11/06/2012  . Hypertension   . 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 KNEE SURGERY Bilateral [phone]   left x 2, right x 1  . TONSILLECTOMY      No current outpatient medications on file.   No current facility-administered medications for this visit.     Allergies as of 05/28/2017 - Review Complete 05/28/2017  Allergen Reaction Noted  . Penicillins Rash 01/08/2012    Family History  Problem Relation Age of Onset  . Breast cancer Mother   . Diverticulitis Mother   . Leukemia Father   . Hypertension Father   . Lupus Sister   . Thyroid disease Cousin   . Heart attack Maternal Grandfather   . Heart attack Paternal Grandfather   . Stroke Paternal Grandfather     Social History   Socioeconomic History  . Marital status: Married    Spouse name: Not on file  . Number of children: Not on file  . Years of education: Not on file  . Highest education level:  Not on file  Social Needs  . Financial resource strain: Not on file  . Food insecurity - worry: Not on file  . Food insecurity - inability: Not on file  . Transportation needs - medical: Not on file  . Transportation needs - non-medical: Not on file  Occupational History  . Not on file  Tobacco Use  . Smoking status: Never Smoker  . Smokeless tobacco: Former Network engineer and Sexual Activity  . Alcohol use: No  . Drug use: No  . Sexual activity: Yes  Other Topics Concern  . Not on file  Social History Narrative  . Not on file    Review of Systems:    Constitutional: No weight loss, fever or chills Skin: No rash  Cardiovascular: No chest  pain Respiratory: No SOB  Gastrointestinal: See HPI and otherwise negative Genitourinary: No dysuria Neurological: No headache Musculoskeletal: No new muscle or joint pain Hematologic: No bleeding  Psychiatric: +depression   Physical Exam:  Vital signs: BP (!) 160/92   Pulse (!) 56   Ht 6\' 3"  (1.905 m)   Wt 263 lb 12.8 oz (119.7 kg)   BMI 32.97 kg/m   Constitutional:   Pleasant Caucasian male appears to be in NAD, Well developed, Well nourished, alert and cooperative Head:  Normocephalic and atraumatic. Eyes:   PEERL, EOMI. No icterus. Conjunctiva pink. Ears:  Normal auditory acuity. Neck:  Supple Throat: Oral cavity and pharynx without inflammation, swelling or lesion.  Respiratory: Respirations even and unlabored. Lungs clear to auscultation bilaterally.   No wheezes, crackles, or rhonchi.  Cardiovascular: Normal S1, S2. No MRG. Regular rate and rhythm. No peripheral edema, cyanosis or pallor.  Gastrointestinal:  Soft, nondistended, mild generalized ttp. No rebound or guarding. Normal bowel sounds. No appreciable masses or hepatomegaly. Rectal:  Not performed.  Msk:  Symmetrical without gross deformities. Without edema, no deformity or joint abnormality.  Neurologic:  Alert and  oriented x4;  grossly normal neurologically.  Skin:   Dry and intact without significant lesions or rashes. Psychiatric: Demonstrates good judgement and reason without abnormal affect or behaviors.  RELEVANT LABS AND IMAGING: CBC    Component Value Date/Time   WBC 5.2 04/25/2017 0922   RBC 4.98 04/25/2017 0922   HGB 15.0 04/25/2017 0922   HCT 43.9 04/25/2017 0922   PLT 220 04/25/2017 0922   MCV 88.2 04/25/2017 0922   MCH 30.1 04/25/2017 0922   MCHC 34.2 04/25/2017 0922   RDW 12.9 04/25/2017 0922   LYMPHSABS 1,290 04/25/2017 0922   MONOABS 0.4 04/02/2017 1103   EOSABS 52 04/25/2017 0922   BASOSABS 31 04/25/2017 0922    CMP     Component Value Date/Time   NA 140 04/25/2017 0922   K 4.6  04/25/2017 0922   CL 106 04/25/2017 0922   CO2 28 04/25/2017 0922   GLUCOSE 99 04/25/2017 0922   BUN 16 04/25/2017 0922   CREATININE 0.88 04/25/2017 0922   CALCIUM 9.7 04/25/2017 0922   PROT 6.5 04/25/2017 0922   ALBUMIN 4.2 04/02/2017 1103   AST 16 04/25/2017 0922   ALT 24 04/25/2017 0922   ALKPHOS 70 04/02/2017 1103   BILITOT 0.8 04/25/2017 0922   GFRNONAA >60 04/02/2017 1103   GFRNONAA 88 01/08/2012 0858   GFRAA >60 04/02/2017 1103   GFRAA >89 01/08/2012 0858   CT ABDOMEN AND PELVIS WITH CONTRAST 04/02/17  TECHNIQUE: Multidetector CT imaging of the abdomen and pelvis was performed using the standard protocol following bolus administration of  intravenous contrast.  CONTRAST:  163mL ISOVUE-300 IOPAMIDOL (ISOVUE-300) INJECTION 61%  COMPARISON:  None.  FINDINGS: Lower chest: No pulmonary nodules or pleural effusion. No visible pericardial effusion.  Hepatobiliary: Normal hepatic contours and density. No visible biliary dilatation. Hepatic cyst in the right hepatic lobe measures 2.5 cm. Normal gallbladder.  Pancreas: Normal contours without ductal dilatation. No peripancreatic fluid collection.  Spleen: Normal.  Adrenals/Urinary Tract:  --Adrenal glands: Normal.  --Right kidney/ureter: No hydronephrosis or perinephric stranding. No nephrolithiasis. No obstructing ureteral stones.  --Left kidney/ureter: No hydronephrosis or perinephric stranding. No nephrolithiasis. No obstructing ureteral stones.  --Urinary bladder: Unremarkable.  Stomach/Bowel:  --Stomach/Duodenum: No hiatal hernia or other gastric abnormality. Normal duodenal course and caliber.  --Small bowel: No dilatation or inflammation.  --Colon: No focal abnormality.  --Appendix: Normal.  Vascular/Lymphatic: Normal course and caliber of the major abdominal vessels. No abdominal or pelvic lymphadenopathy.  Reproductive: Normal prostate and seminal  vesicles.  Musculoskeletal. No bony spinal canal stenosis or focal osseous abnormality.  Other: None.  IMPRESSION: No acute abdominopelvic abnormality.   Electronically Signed   By: Ulyses Jarred M.D.   On: 04/02/2017 14:59  Assessment: 1.  Change in bowel habits: Towards loose stools, typically after eating sugary/starchy foods over the past 5-6 months, was previously on a PPI, also after stopping the low-dose antidepressant; consider SIBOl versus IBS-D 2.  Abdominal pain: Cramping pain with above 3.  Bloating  Plan: 1.  We will treat patient empirically for suspected SEBO with Xifaxan 550 mg 3 times daily times 2 weeks. 2.  If above does not help patient, would recommend that he have a colonoscopy for further evaluation of current symptoms.  We did discuss this today. 3.  Patient to remain on the low FODMAP diet as he is able, as he has resolution of symptoms. 4.  Also discussed possibility of IBS tpday 5.  Patient to follow with me in 4-6 weeks.  Assigned to Dr. Fuller Plan today.  Eugene Newer, PA-C [facility] 05/28/2017, 9:58 AM  Cc: Eugene Coleman*

## 2017-05-28 NOTE — Patient Instructions (Signed)
If you are age 49 or older, your body mass index should be between 23-30. Your Body mass index is 32.97 kg/m. If this is out of the aforementioned range listed, please consider follow up with your Primary Care Provider.  If you are age 69 or younger, your body mass index should be between 19-25. Your Body mass index is 32.97 kg/m. If this is out of the aformentioned range listed, please consider follow up with your Primary Care Provider.   We have sent your demographic information and a prescription for Xifaxan to [facility]. This pharmacy is able to get medication approved through insurance and get you the lowest copay possible. If you have not heard from them within 1 week, please call our office at [phone] to let us know.

## 2017-05-28 NOTE — Telephone Encounter (Signed)
Augmentin 875 mg BID x10 days. Thanks-JLL

## 2017-05-29 NOTE — Telephone Encounter (Signed)
Please apologize to patient that I did not see his Penicillin allergy. He could also try Metronidazole 500mg  TID + Cephalexin 500mg  TID for 10 days. Thanks-JLL

## 2017-05-29 NOTE — Telephone Encounter (Signed)
See Note below

## 2017-06-11 ENCOUNTER — Other Ambulatory Visit: Payer: Self-pay

## 2017-06-11 ENCOUNTER — Encounter (HOSPITAL_COMMUNITY): Payer: Self-pay | Admitting: Emergency Medicine

## 2017-06-11 ENCOUNTER — Emergency Department (HOSPITAL_COMMUNITY)
Admission: EM | Admit: 2017-06-11 | Discharge: 2017-06-12 | Disposition: A | Discharge status: Home / Self Care | Payer: BLUE CROSS/BLUE SHIELD | Attending: Emergency Medicine | Admitting: Emergency Medicine

## 2017-06-11 DIAGNOSIS — I10 Essential (primary) hypertension: Secondary | ICD-10-CM | POA: Diagnosis not present

## 2017-06-11 DIAGNOSIS — R1013 Epigastric pain: Secondary | ICD-10-CM | POA: Diagnosis not present

## 2017-06-11 DIAGNOSIS — Z79899 Other long term (current) drug therapy: Secondary | ICD-10-CM | POA: Diagnosis not present

## 2017-06-11 LAB — CBC
HEMATOCRIT: 44 % (ref 39.0–52.0)
HEMOGLOBIN: 14.6 g/dL (ref 13.0–17.0)
MCH: 29.8 pg (ref 26.0–34.0)
MCHC: 33.2 g/dL (ref 30.0–36.0)
MCV: 89.8 fL (ref 78.0–100.0)
Platelets: 235 10*3/uL (ref 150–400)
RBC: 4.9 MIL/uL (ref 4.22–5.81)
RDW: 12.9 % (ref 11.5–15.5)
WBC: 9 10*3/uL (ref 4.0–10.5)

## 2017-06-11 LAB — URINALYSIS, ROUTINE W REFLEX MICROSCOPIC
BILIRUBIN URINE: NEGATIVE
GLUCOSE, UA: NEGATIVE mg/dL
Hgb urine dipstick: NEGATIVE
KETONES UR: 5 mg/dL — AB
Leukocytes, UA: NEGATIVE
Nitrite: NEGATIVE
PH: 6 (ref 5.0–8.0)
Protein, ur: NEGATIVE mg/dL
Specific Gravity, Urine: 1.025 (ref 1.005–1.030)

## 2017-06-11 LAB — COMPREHENSIVE METABOLIC PANEL
ALBUMIN: 3.8 g/dL (ref 3.5–5.0)
ALT: 25 U/L (ref 17–63)
ANION GAP: 9 (ref 5–15)
AST: 21 U/L (ref 15–41)
Alkaline Phosphatase: 64 U/L (ref 38–126)
BILIRUBIN TOTAL: 0.8 mg/dL (ref 0.3–1.2)
BUN: 15 mg/dL (ref 6–20)
CHLORIDE: 106 mmol/L (ref 101–111)
CO2: 25 mmol/L (ref 22–32)
Calcium: 9.3 mg/dL (ref 8.9–10.3)
Creatinine, Ser: 1.11 mg/dL (ref 0.61–1.24)
GFR calc Af Amer: >60 mL/min (ref >60)
GFR calc non Af Amer: >60 mL/min (ref >60)
GLUCOSE: 98 mg/dL (ref 65–99)
POTASSIUM: 4.2 mmol/L (ref 3.5–5.1)
SODIUM: 140 mmol/L (ref 135–145)
Total Protein: 6.8 g/dL (ref 6.5–8.1)

## 2017-06-11 LAB — I-STAT TROPONIN, ED: Troponin i, poc: 0 ng/mL (ref 0.00–0.08)

## 2017-06-11 LAB — LIPASE, BLOOD: LIPASE: 30 U/L (ref 11–51)

## 2017-06-11 MED ORDER — GLYCOPYRROLATE 0.2 MG/ML IJ SOLN
0.1000 mg | Freq: Once | INTRAMUSCULAR | Status: AC
  Administered 2017-06-11: 0.1 mg via INTRAVENOUS
  Filled 2017-06-11: qty 1

## 2017-06-11 MED ORDER — GI COCKTAIL ~~LOC~~
30.0000 mL | Freq: Once | ORAL | Status: AC
  Administered 2017-06-11: 30 mL via ORAL
  Filled 2017-06-11: qty 30

## 2017-06-11 NOTE — ED Notes (Signed)
ED Provider at bedside. 

## 2017-06-11 NOTE — ED Triage Notes (Signed)
Patient to ED from home c/o worsened abd pain today - has been seen for same multiple times, f/u at [place] and was dx with CIBO - taking Xifaxan (almost finished with). Pt has had this intermittent abd pain and diarrhea since November, has already had a CT and MRI of abdomen. Last BM today, denies N/V.

## 2017-06-11 NOTE — ED Notes (Signed)
Nurse drawing labs. 

## 2017-06-11 NOTE — ED Provider Notes (Signed)
[facility] EMERGENCY DEPARTMENT Provider Note   CSN: [id number] Arrival date & time: 06/11/17  1742     History   Chief Complaint Chief Complaint  Patient presents with  . Abdominal Pain    HPI  Blood pressure (!) 140/99, pulse 71, temperature 98.7 F (37.1 C), temperature source Oral, resp. rate 16, SpO2 97 %.  Eugene Coleman is a 47 y.o. male complaining of severe epigastric pain worsening over the course of the last 2 days.  He states that he is being seen by [facility] and being evaluated for a possible small bowel bacterial overgrowth syndrome he is on day 13 of 14 of antibiotics.  He states that his pain has significantly worsened and changed in severity but not location and character over the course last 2 days is been no fever, chills, nausea, vomiting, chest pain, shortness of breath he is reporting a looser than normal stool and he feels that the food is passing through undigested.  He denies any melena or hematochezia.  He denies taking any pain medication prior to arrival.   Past Medical History:  Diagnosis Date  . Generalized anxiety disorder 11/06/2012  . Hypertension   . Rheumatoid factor positive with cyclic citrullinated peptide (CCP) antibody negative 04/29/2017    Patient Active Problem List   Diagnosis Date Noted  . 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 Rash and nonspecific skin eruption 04/25/2017  . Family history of autoimmune disorder 04/25/2017  . Fatigue 04/25/2017  . Family history of inflammatory bowel disease 04/25/2017  . 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 Insomnia 12/09/2012  . GERD (gastroesophageal reflux disease) 12/09/2012  . Essential hypertension, benign 11/06/2012  . Generalized anxiety disorder 11/06/2012  . Right sided abdominal pain 01/08/2012    Past Surgical History:  Procedure Laterality Date  . KNEE SURGERY Bilateral  [phone]   left x 2, right x 1  . TONSILLECTOMY          Home Medications    Prior to Admission medications   Medication Sig Start Date End Date Taking? Authorizing Provider  rifaximin (XIFAXAN) 550 MG TABS tablet Take 1 tablet (550 mg total) by mouth 3 (three) times daily. 05/28/17  Yes Levin Erp, PA  dicyclomine (BENTYL) 10 MG capsule Take 2 capsules (20 mg total) by mouth 4 (four) times daily as needed for spasms. 06/12/17   Torrell Krutz, Elmyra Ricks, PA-C  pantoprazole (PROTONIX) 20 MG tablet Take 1 tablet (20 mg total) by mouth daily. 06/12/17   Dallie Patton, Elmyra Ricks, PA-C  sucralfate (CARAFATE) 1 GM/10ML suspension Take 10 mLs (1 g total) by mouth 4 (four) times daily -  with meals and at bedtime. 06/12/17   Mahmoud Blazejewski, Elmyra Ricks, PA-C    Family History Family History  Problem Relation Age of Onset  . Breast cancer Mother   . Diverticulitis Mother   . Leukemia Father   . Hypertension Father   . Lupus Sister   . Thyroid disease Cousin   . Heart attack Maternal Grandfather   . Heart attack Paternal Grandfather   . Stroke Paternal Grandfather     Social History Social History   Tobacco Use  . Smoking status: Never Smoker  . Smokeless tobacco: Former Network engineer Use Topics  . Alcohol use: No  . Drug use: No     Allergies   Penicillins   Review of Systems Review of Systems  A complete review of systems was  obtained and all systems are negative except as noted in the HPI and PMH.   Physical Exam Updated Vital Signs BP 137/85   Pulse (!) 51   Temp 98.7 F (37.1 C) (Oral)   Resp 13   SpO2 95%   Physical Exam  Constitutional: He is oriented to person, place, and time. He appears well-developed and well-nourished. No distress.  HENT:  Head: Normocephalic and atraumatic.  Mouth/Throat: Oropharynx is clear and moist.  Eyes: Pupils are equal, round, and reactive to light. Conjunctivae and EOM are normal.  Neck: Normal range of motion.  Cardiovascular:  Normal rate, regular rhythm and intact distal pulses.  Pulmonary/Chest: Effort normal and breath sounds normal.  Abdominal: Soft. Bowel sounds are normal. He exhibits no distension and no mass. There is no tenderness. There is no rebound and no guarding. No hernia.  Musculoskeletal: Normal range of motion.  Neurological: He is alert and oriented to person, place, and time.  Skin: He is not diaphoretic.  Psychiatric: He has a normal mood and affect.  Nursing note and vitals reviewed.    ED Treatments / Results  Labs (all labs ordered are listed, but only abnormal results are displayed) Labs Reviewed  URINALYSIS, ROUTINE W REFLEX MICROSCOPIC - Abnormal; Notable for the following components:      Result Value   Ketones, ur 5 (*)    All other components within normal limits  LIPASE, BLOOD  COMPREHENSIVE METABOLIC PANEL  CBC  I-STAT TROPONIN, ED    EKG EKG Interpretation  Date/Time:  Wednesday June 11 2017 23:42:57 EDT Ventricular Rate:  63 PR Interval:    QRS Duration: 91 QT Interval:  432 QTC Calculation: 443 R Axis:   10 Text Interpretation:  Sinus rhythm Abnormal R-wave progression, early transition Left ventricular hypertrophy similar to prior Confirmed by Aletta Edouard [phone]) on 06/11/2017 11:47:47 PM   Radiology No results found.  Procedures Procedures (including critical care time)  Medications Ordered in ED Medications  glycopyrrolate (ROBINUL) injection 0.1 mg (0.1 mg Intravenous Given 06/11/17 2235)  gi cocktail (Maalox,Lidocaine,Donnatal) (30 mLs Oral Given 06/11/17 2233)     Initial Impression / Assessment and Plan / ED Course  I have reviewed the triage vital signs and the nursing notes.  Pertinent labs & imaging results that were available during my care of the patient were reviewed by me and considered in my medical decision making (see chart for details).     Vitals:   06/11/17 1806 06/11/17 2100 06/11/17 2316 06/11/17 2330  BP: (!) 147/98 (!)  140/99 (!) 140/91 137/85  Pulse: 65 71 66 (!) 51  Resp: 20 16 18 13   Temp: 98.7 F (37.1 C)     TempSrc: Oral     SpO2: 98% 97% 94% 95%    Medications  glycopyrrolate (ROBINUL) injection 0.1 mg (0.1 mg Intravenous Given 06/11/17 2235)  gi cocktail (Maalox,Lidocaine,Donnatal) (30 mLs Oral Given 06/11/17 2233)    Eugene Coleman is 47 y.o. male presenting with severe epigastric abdominal pain worsening in severity over the course the last several days.  He is being evaluated by [facility].  Abdominal exam is reassuring today, he is tolerating p.o.'s.  Afebrile.  Blood work is also reassuring, would consider a atypical ACS however think this is very unlikely however given patient's concern will obtain EKG and troponin.  EKG with no ischemic changes however slightly changed from prior.  Troponin negative, repeat EKG unchanged  GI consult from Dr. Loletha Carrow appreciated: He will arrange for  expedited follow-up for this patient, does not recommend any specific treatment at this time.  Evaluation does not show pathology that would require ongoing emergent intervention or inpatient treatment. Pt is hemodynamically stable and mentating appropriately. Discussed findings and plan with patient/guardian, who agrees with care plan. All questions answered. Return precautions discussed and outpatient follow up given.     Final Clinical Impressions(s) / ED Diagnoses   Final diagnoses:  Epigastric pain    ED Discharge Orders        Ordered    pantoprazole (PROTONIX) 20 MG tablet  Daily     06/12/17 0002    dicyclomine (BENTYL) 10 MG capsule  4 times daily PRN     06/12/17 0002    sucralfate (CARAFATE) 1 GM/10ML suspension  3 times daily with meals & bedtime     06/12/17 0002       Kreig Parson, Charna Elizabeth 06/12/17 0005    Hayden Rasmussen, MD 06/13/17 1135

## 2017-06-12 ENCOUNTER — Telehealth: Payer: Self-pay | Admitting: Gastroenterology

## 2017-06-12 DIAGNOSIS — R1013 Epigastric pain: Secondary | ICD-10-CM

## 2017-06-12 MED ORDER — SUCRALFATE 1 GM/10ML PO SUSP
1.0000 g | Freq: Three times a day (TID) | ORAL | 0 refills | Status: DC
Start: 2017-06-12 — End: 2017-07-08

## 2017-06-12 MED ORDER — PANTOPRAZOLE SODIUM 20 MG PO TBEC: 20.0000 mg | DELAYED_RELEASE_TABLET | Freq: Every day | ORAL | 0 refills | Status: DC

## 2017-06-12 MED ORDER — DICYCLOMINE HCL 10 MG PO CAPS: 20.0000 mg | ORAL_CAPSULE | Freq: Four times a day (QID) | ORAL | 0 refills | Status: DC | PRN

## 2017-06-12 NOTE — Discharge Instructions (Addendum)
Please follow with your primary care doctor in the next 2 days for a check-up. They must obtain records for further management.   Do not hesitate to return to the Emergency Department for any new, worsening or concerning symptoms.   Do not take any NSAIDs (motrin, ibuprofen, advil, aleve, excedrin, aspirin, naproxen, goody's powder,  etc.) because they can further hurt your kidneys.

## 2017-06-12 NOTE — Telephone Encounter (Signed)
Please offer him EGD and RUQ [place] or office appt with me or APP.

## 2017-06-12 NOTE — Telephone Encounter (Signed)
Patient calling in regarding this. Best # [phone]

## 2017-06-12 NOTE — Telephone Encounter (Signed)
Patient notified of recommendaitons EGD and previsit arranged for Eugene Coleman 06/16/17 7:00 at [facility] he is advised to arrive at 6:45 and be NPO after midnight

## 2017-06-12 NOTE — Telephone Encounter (Signed)
[facility] ED doc called me last night, this patient presented with epigastric pain near the end of his empiric treatment for SIBO.  Exam reportedly benign, labs normal. Sent home. Please arrange follow up.  Seen by Governor Rooks 3/20, MS supervising.

## 2017-06-16 ENCOUNTER — Telehealth: Payer: Self-pay

## 2017-06-16 ENCOUNTER — Other Ambulatory Visit: Payer: Self-pay

## 2017-06-16 ENCOUNTER — Ambulatory Visit (HOSPITAL_COMMUNITY)
Admission: RE | Admit: 2017-06-16 | Discharge: 2017-06-16 | Disposition: A | Discharge status: Home / Self Care | Payer: BLUE CROSS/BLUE SHIELD | Source: Ambulatory Visit | Attending: Gastroenterology | Admitting: Gastroenterology

## 2017-06-16 ENCOUNTER — Ambulatory Visit (AMBULATORY_SURGERY_CENTER): Payer: Self-pay

## 2017-06-16 ENCOUNTER — Encounter: Payer: Self-pay | Admitting: Gastroenterology

## 2017-06-16 VITALS — Ht 75.0 in | Wt 254.6 lb

## 2017-06-16 DIAGNOSIS — K580 Irritable bowel syndrome with diarrhea: Secondary | ICD-10-CM

## 2017-06-16 DIAGNOSIS — K7689 Other specified diseases of liver: Secondary | ICD-10-CM | POA: Diagnosis not present

## 2017-06-16 DIAGNOSIS — R1013 Epigastric pain: Secondary | ICD-10-CM | POA: Diagnosis not present

## 2017-06-16 DIAGNOSIS — K529 Noninfective gastroenteritis and colitis, unspecified: Secondary | ICD-10-CM

## 2017-06-16 DIAGNOSIS — K6389 Other specified diseases of intestine: Secondary | ICD-10-CM

## 2017-06-16 NOTE — Telephone Encounter (Signed)
Please schedule office appt before scheduling any procedure or any other test.

## 2017-06-16 NOTE — Progress Notes (Signed)
No egg or soy allergy known to patient  Pt verbalize having a hard time waking up from knee surgery in 1995 or 1996. Pt verbalize they had a hard time putting tube down his throat during knee surgery.  No diet pills per patient No home 02 use per patient  No blood thinners per patient  Pt denies issues with constipation  No A fib or A flutter  EMMI video sent to pt's e mail , sent during previsit.

## 2017-06-16 NOTE — Telephone Encounter (Signed)
Pt verbalize having a difficulty time with intubation during knee surgery.Stating "they almost tore my trachea up" Pt had concerns about having ultasound done this morning and finding gallstones. Wants to know if it will affect his procedure. Pt verbalize if he has to be sedated can he have the colonoscopy done at the same time, since he we be needing one soon. Pt stated " I wouldn't mind having office visit with the doctor about my concerns". Advised pt I would let Dr. Fuller Plan and CRNA aware of his concerns.

## 2017-06-16 NOTE — Telephone Encounter (Signed)
Patient notified and will come in on 06/18/17 9:30 to discuss

## 2017-06-18 ENCOUNTER — Encounter: Payer: Self-pay | Admitting: Gastroenterology

## 2017-06-18 ENCOUNTER — Ambulatory Visit: Payer: BLUE CROSS/BLUE SHIELD | Admitting: Gastroenterology

## 2017-06-18 ENCOUNTER — Encounter: Payer: Self-pay | Admitting: Physician Assistant

## 2017-06-18 ENCOUNTER — Ambulatory Visit (INDEPENDENT_AMBULATORY_CARE_PROVIDER_SITE_OTHER): Payer: BLUE CROSS/BLUE SHIELD | Admitting: Physician Assistant

## 2017-06-18 VITALS — BP 140/96 | HR 84 | Ht 75.0 in | Wt 247.0 lb

## 2017-06-18 VITALS — BP 144/93 | HR 71 | Wt 248.0 lb

## 2017-06-18 DIAGNOSIS — R197 Diarrhea, unspecified: Secondary | ICD-10-CM

## 2017-06-18 DIAGNOSIS — F5105 Insomnia due to other mental disorder: Secondary | ICD-10-CM | POA: Diagnosis not present

## 2017-06-18 DIAGNOSIS — R1011 Right upper quadrant pain: Secondary | ICD-10-CM

## 2017-06-18 DIAGNOSIS — Z9189 Other specified personal risk factors, not elsewhere classified: Secondary | ICD-10-CM | POA: Diagnosis not present

## 2017-06-18 DIAGNOSIS — R1030 Lower abdominal pain, unspecified: Secondary | ICD-10-CM

## 2017-06-18 DIAGNOSIS — IMO0001 Reserved for inherently not codable concepts without codable children: Secondary | ICD-10-CM

## 2017-06-18 DIAGNOSIS — F411 Generalized anxiety disorder: Secondary | ICD-10-CM | POA: Diagnosis not present

## 2017-06-18 DIAGNOSIS — R209 Unspecified disturbances of skin sensation: Secondary | ICD-10-CM | POA: Diagnosis not present

## 2017-06-18 DIAGNOSIS — F419 Anxiety disorder, unspecified: Secondary | ICD-10-CM | POA: Diagnosis not present

## 2017-06-18 DIAGNOSIS — I10 Essential (primary) hypertension: Secondary | ICD-10-CM

## 2017-06-18 DIAGNOSIS — G5621 Lesion of ulnar nerve, right upper limb: Secondary | ICD-10-CM | POA: Diagnosis not present

## 2017-06-18 HISTORY — DX: Lesion of ulnar nerve, right upper limb: G56.21

## 2017-06-18 MED ORDER — PROPRANOLOL HCL ER 60 MG PO CP24: 60.0000 mg | ORAL_CAPSULE | Freq: Every day | ORAL | 3 refills | Status: DC

## 2017-06-18 NOTE — Progress Notes (Signed)
HPI:                                                                Eugene Coleman is a 47 y.o. male who presents to [place]: [place] today for hospital discharge follow-up   Patient presents today with multiple concerns.  He presented to the ED on 06/11/17 with severe epigastric pain. He was treated with GI cocktail and discharged with follow-up with GI. He completed antibiotic therapy for SBO and has a UGI planned for later this month.  He is concerned about his blood pressure. His weight has fluctuated due to his other health issues and he has noticed his BP has increased. He has always managed this with diet and exercise and has started exercising twice a day and has lost approx. 20 pounds over the last 6 months.  He reports a history of GAD in his 15's. He used to take Xanax and Paxil for this. He tapered himself off of medication years ago and is not interested in being on medication. He has felt worsening anxiety including difficulty falling asleep, GI upset, palpitations, numbness and tingling in fingertips and toes, migriane headaches, nightsweats, flushing, and fatigue.  He states while falling asleep he will awaken suddenly and his entire right arm will be numb and tingling. Symptoms resolve on their own within minutes. This is causing further sleep disturbance. Denies constitutional symptoms, joint pain/swelling, or rash. Denies injury/trauma of the right arm/elbow.  He underwent autoimmune testing in February of 2019. He had a weekly positive RF of 22 w/negative CCP Ab. Thyroid studies, Cortisol, ESR, ANA, CBC, CMP were all within normal limits.  He is requesting that he is tested for lyme disease. Reports history of multiple tick bites over his lifetime. He has never been treated for this.   No flowsheet data found.    Past Medical History:  Diagnosis Date  . Allergy   . Generalized anxiety disorder 11/06/2012  . Hyperlipidemia    . Hypertension   . 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 KNEE SURGERY Bilateral [phone]   left x 2, right x 1  . TONSILLECTOMY    . wisdow teeth     childhood   Social History   Tobacco Use  . Smoking status: Never Smoker  . Smokeless tobacco: Former Network engineer Use Topics  . Alcohol use: No   family history includes Breast cancer in his mother; Diverticulitis in his mother; Heart attack in his maternal grandfather and paternal grandfather; Hypertension in his father; Leukemia in his father; Lupus in his sister; Stroke in his paternal grandfather; Thyroid disease in his cousin.    ROS: negative except as noted in the HPI  Medications: Current Outpatient Medications  Medication Sig Dispense Refill  . pantoprazole (PROTONIX) 20 MG tablet Take 1 tablet (20 mg total) by mouth daily. 30 tablet 0  . sucralfate (CARAFATE) 1 GM/10ML suspension Take 10 mLs (1 g total) by mouth 4 (four) times daily -  with meals and at bedtime. 420 mL 0   No current facility-administered medications for this visit.    Allergies  Allergen Reactions  . Penicillins Rash  Was allergic to it as a child       Objective:  BP (!) 144/93 (BP Location: Right Arm)   Pulse 71   Wt 248 lb (112.5 kg)   BMI 31.00 kg/m  Gen:  alert, not ill-appearing, no distress, appropriate for age, obese male HEENT: head normocephalic without obvious abnormality, conjunctiva and cornea clear, wearing glasses, neck supple, no adenopathy, trachea midline Pulm: Normal work of breathing, normal phonation, clear to auscultation bilaterally, no wheezes, rales or rhonchi CV: Normal rate, regular rhythm, s1 and s2 distinct, no murmurs, clicks or rubs  Neuro: alert and oriented x 3, no tremor MSK: right upper extremity atraumatic, no A/C joint tenderness or crepitus, no edema, full active ROM, negative  Tinell's sign, strength 5/5 symmetric in bilateral upper extremities, normal gait and station Skin: intact, no rashes on exposed skin, no jaundice, no cyanosis Psych: well-groomed, cooperative, good eye contact, euthymic mood, affect mood-congruent, speech is articulate, and thought processes clear and goal-directed    No results found for this or any previous visit (from the past 72 hour(s)). US Abdomen Limited Ruq  Result Date: 06/16/2017 CLINICAL DATA:  47 year old with epigastric pain for 6 months. EXAM: ULTRASOUND ABDOMEN LIMITED RIGHT UPPER QUADRANT COMPARISON:  CT 04/02/2017 FINDINGS: Gallbladder: Echogenic foci in the gallbladder which could represent small stones and/or polyps. Largest echogenic focus measures 0.8 cm. No gallbladder wall thickening or distention. No sonographic Murphy sign. Common bile duct: Diameter: 0.5 cm Liver: Anechoic cyst near the right hepatic dome measuring up to 2.4 cm. Normal echotexture in the liver. Portal vein is patent on color Doppler imaging with normal direction of blood flow towards the liver. IMPRESSION: Small echogenic foci in the gallbladder. Findings are compatible with small stones and/or polyps. No evidence for acute gallbladder inflammation. Hepatic cyst. Electronically Signed   By: Markus Daft M.D.   On: 06/16/2017 09:05      Assessment and Plan: 47 y.o. male with   1. Hypertension goal BP (blood pressure) < 130/80 BP Readings from Last 3 Encounters:  06/18/17 (!) 144/93  06/18/17 (!) 140/96  06/11/17 137/85  - BP out of range in office today. Asymptomatic. Counseled on aggressive therapeutic lifestyle changes. He is interested in a beta blocker, which he thinks may also help with his anxiety. Discussed the beta blockers are not typically used first line, due to modest effect on BP, but I agree this may be a good option for him given co-morbid GAD and migraines - If BP is not controlled with lifestyle and Propranolol, will recommend adding  ACE, ARB or CCB  Ulnar neuropathy at elbow of right upper extremity - patient provided with elbow sleeve for nighttime use - follow-up with Sports medicine in 4-6 weeks  Paresthesias/numbness - I think this is likely due to anxiety/panic attack - it is intermittent, involves fingertips, toes and mouth. Will check labs to r/o myositis, B12 deficiency, thyroid disease, electrolyte disturbance - CBC with Differential/Platelet - Vitamin B12 - TSH + free T4 - Comprehensive metabolic panel - Sed Rate (ESR) - CK - Lactate dehydrogenase  GAD/insomnia - he declines medication management, but may start Propranolol - continue regular CV exercise - sleep hygiene / trial of Melatonin 3 mg QHS - provided with information on CBT. He will let me know if he wants a referral  Hypertension goal BP (blood pressure) < 130/80 - Plan: propranolol ER (INDERAL LA) 60 MG 24 hr capsule  Risk of exposure to Lyme disease - Plan: B. burgdorfi  antibodies  Insomnia secondary to anxiety  Ulnar neuropathy at elbow of right upper extremity  Paresthesias/numbness - Plan: CBC with Differential/Platelet, Vitamin B12, TSH + free T4, Comprehensive metabolic panel, Sed Rate (ESR), CK, Lactate dehydrogenase  Generalized anxiety disorder    Patient education and anticipatory guidance given Patient agrees with treatment plan Follow-up in 3 months for hypertension or sooner as needed if symptoms worsen or fail to improve  I spent 40 minutes with this patient, greater than 50% was face-to-face time counseling regarding the above diagnoses   Darlyne Russian PA-C

## 2017-06-18 NOTE — Patient Instructions (Signed)
Follow the directions on the IBGard sample box and you can also take FDGard over the counter daily.   You have been scheduled for an Upper GI Series at [facility]. Your appointment is on 06/30/17 at 11:00am. Please arrive 15 minutes prior to your test for registration. Make sure not to eat or drink anything 3 hours prior to your test. If you need to reschedule, please call radiology at [phone]. ________________________________________________________________ An upper GI series uses x rays to help diagnose problems of the upper GI tract, which includes the esophagus, stomach, and duodenum. The duodenum is the first part of the small intestine. An upper GI series is conducted by a radiology technologist or a radiologist-a doctor who specializes in x-ray imaging-at a hospital or outpatient center. While sitting or standing in front of an x-ray machine, the patient drinks barium liquid, which is often white and has a chalky consistency and taste. The barium liquid coats the lining of the upper GI tract and makes signs of disease show up more clearly on x rays. X-ray video, called fluoroscopy, is used to view the barium liquid moving through the esophagus, stomach, and duodenum. Additional x rays and fluoroscopy are performed while the patient lies on an x-ray table. To fully coat the upper GI tract with barium liquid, the technologist or radiologist may press on the abdomen or ask the patient to change position. Patients hold still in various positions, allowing the technologist or radiologist to take x rays of the upper GI tract at different angles. If a technologist conducts the upper GI series, a radiologist will later examine the images to look for problems.  This test typically takes about 1 hour to complete. __________________________________________________________________  Normal BMI (Body Mass Index- based on height and weight) is between 19 and 25. Your BMI today is Body mass index is  30.87 kg/m. Marland Kitchen Please consider follow up  regarding your BMI with your Primary Care Provider.  Thank you for choosing me and [facility].  Pricilla Riffle. Dagoberto Ligas., MD., Marval Regal

## 2017-06-18 NOTE — Progress Notes (Signed)
    History of Present Illness: This is a 47 year old male with crampy abdominal pain.  He relates that he has an anxiety disorder which was treated with Xanax and Paxil in the past.  When his anxiety has been under good control he has not had any gastrointestinal symptoms.  He has been off all medications for anxiety for about 6 months. After stopping medications he noted the return of anxiety symptoms and then the return of looser stools, lower abdominal cramping and right upper abdominal pain.  With a low carb, low FODMAP diet his lower abdominal symptoms and looser stools have improved symptoms.  He has persistent problems with mildly loose stools bloating and right upper quadrant pain.  The right upper quadrant pain worsens after meals.  Dicyclomine 20 mg led to drowsiness and he discontinued it.  He took a course of Xifaxan for possible IBS or SIBO. He feels this may have contributed to his symptoms improving however he made dietary changes at the same time so it is difficult to know.  He states he had a difficult intubation during surgery at age 44 and prefers not to have any sedated procedure unless absolutely necessary.  CMP, lipase and CBC on 06/11/2017 normal.  RUQ [place] 06/2017 Small echogenic foci in the gallbladder. Findings are compatible with small stones and/or polyps. No evidence for acute gallbladder Inflammation.  Abd/pelvic CT 03/2017 negative  Current Medications, Allergies, Past Medical History, Past Surgical History, Family History and Social History were reviewed in [employer] record.  Physical Exam: General: Well developed, well nourished, no acute distress Head: Normocephalic and atraumatic Eyes:  sclerae anicteric, EOMI Ears: Normal auditory acuity Mouth: No deformity or lesions Lungs: Clear throughout to auscultation Heart: Regular rate and rhythm; no murmurs, rubs or bruits Abdomen: Soft, non tender and non distended. No masses, hepatosplenomegaly  or hernias noted. Normal Bowel sounds Rectal: not done Musculoskeletal: Symmetrical with no gross deformities  Pulses:  Normal pulses noted Extremities: No clubbing, cyanosis, edema or deformities noted Neurological: Alert oriented x 4, grossly nonfocal Psychological:  Alert and cooperative. Normal mood and affect  Assessment and Recommendations:  1. RUQ pain.  Symptoms are not typical biliary symptoms.  R/O ulcer, IBS. Abdominal ultrasound not able to determine polyps versus gallstones.  Patient declines endoscopy at this time due to sedation and the rare potential need for intubation. Schedule UGI series.  Consider CCK HIDA scan if the UGI series is unrevealing.  Continue pantoprazole 20 mg daily.  Continue Carafate 10 mg 4 times daily as needed.  Gastrointestinal symptoms are clearly more active trial of dicyclomine 10 mg 3 times daily as needed.  If not effective or if side effects occur then trial of IBGard 2 tid prn. If not effective then a trial of FDGard.  2. IBS.  As above.  3. GAD.  His gastrointestinal symptoms are active when his anxiety is not under good control. Follow-up with PCP to consider reinstituting medication similar to Paxil or other therapies.  He states he prefers not to use Paxil again.

## 2017-06-18 NOTE — Patient Instructions (Addendum)
You may also want to look into [facility]. They have experience treating chronic lyme disease and fatigue.   Counseling Options: - psychologytoday.com: search engine to locate local counselors - Family Services in your county offer counseling on a sliding scale (pay what you can afford) - [place]: we can place a referral for you to see one of licensed counselors in [place], [place], or [place] - online counseling: [place] and [facility] (not covered by insurance, but affordable self-pay rates)  Other resources: - https://www.[place].net/ - 7cupsoftea - ArmyDictionary.fi  Safety Plan: if having self-harm or suicidal thoughts Our Office [place] 1-800-SUICIDE If in immediate danger of harming yourself, go to the nearest emergency room or call 911    Wear elbow sleeve at night Sleep Hygiene . Limiting daytime naps to 30 minutes . Napping does not make up for inadequate nighttime sleep. However, a short nap of 20-30 minutes can help to improve mood, alertness and performance.  . Avoiding stimulants such as  caffeine and nicotine close to bedtime.  And when it comes to alcohol, moderation is key 4. While alcohol is well-known to help you fall asleep faster, too much close to bedtime can disrupt sleep in the second half of the night as the body begins to process the alcohol.    . Exercising to promote good quality sleep.  As little as 10 minutes of aerobic exercise, such as walking or cycling, can drastically improve nighttime sleep quality.  For the best night's sleep, most people should avoid strenuous workouts close to bedtime. However, the effect of intense nighttime exercise on sleep differs from person to person, so find out what works best for you.   . Steering clear of food that can be disruptive right  before sleep.   Heavy or rich foods, fatty or fried meals, spicy dishes, citrus fruits, and carbonated drinks can trigger indigestion for some people. When this occurs close to bedtime, it can lead to painful heartburn that disrupts sleep. . Ensuring adequate exposure to natural light.  This is particularly important for individuals who may not venture outside frequently. Exposure to sunlight during the day, as well as darkness at night, helps to maintain a healthy sleep-wake cycle . Marland Kitchen Establishing a regular relaxing bedtime routine.  A regular nightly routine helps the body recognize that it is bedtime. This could include taking warm shower or bath, reading a book, or light stretches. When possible, try to avoid emotionally upsetting conversations and activities before attempting to sleep. . Making sure that the sleep environment is pleasant.  Mattress and pillows should be comfortable. The bedroom should be cool - between 60 and 67 degrees - for optimal sleep. Bright light from lamps, cell phone and TV screens can make it difficult to fall asleep4, so turn those light off or adjust them when possible. Consider using blackout curtains, eye shades, ear plugs, "white noise" machines, humidifiers, fans and other devices that can make the bedroom more relaxing. . Meditation. YouTube [place] [address]. There are many smartphone apps as well

## 2017-06-19 ENCOUNTER — Encounter: Payer: Self-pay | Admitting: Physician Assistant

## 2017-06-19 LAB — COMPREHENSIVE METABOLIC PANEL
AG Ratio: 2.3 (calc) (ref 1.0–2.5)
ALKALINE PHOSPHATASE (APISO): 67 U/L (ref 40–115)
ALT: 19 U/L (ref 9–46)
AST: 16 U/L (ref 10–40)
Albumin: 4.8 g/dL (ref 3.6–5.1)
BILIRUBIN TOTAL: 0.8 mg/dL (ref 0.2–1.2)
BUN: 13 mg/dL (ref 7–25)
CO2: 25 mmol/L (ref 20–32)
Calcium: 9.9 mg/dL (ref 8.6–10.3)
Chloride: 103 mmol/L (ref 98–110)
Creat: 0.9 mg/dL (ref 0.60–1.35)
Globulin: 2.1 g/dL (calc) (ref 1.9–3.7)
Glucose, Bld: 83 mg/dL (ref 65–139)
Potassium: 4.3 mmol/L (ref 3.5–5.3)
Sodium: 139 mmol/L (ref 135–146)
TOTAL PROTEIN: 6.9 g/dL (ref 6.1–8.1)

## 2017-06-19 LAB — CBC WITH DIFFERENTIAL/PLATELET
BASOS PCT: 0.3 %
Basophils Absolute: 26 cells/uL (ref 0–200)
EOS ABS: 44 {cells}/uL (ref 15–500)
Eosinophils Relative: 0.5 %
HCT: 48.1 % (ref 38.5–50.0)
Hemoglobin: 16.4 g/dL (ref 13.2–17.1)
Lymphs Abs: 2236 cells/uL (ref 850–3900)
MCH: 30 pg (ref 27.0–33.0)
MCHC: 34.1 g/dL (ref 32.0–36.0)
MCV: 88.1 fL (ref 80.0–100.0)
MONOS PCT: 5.7 %
MPV: 12.2 fL (ref 7.5–12.5)
Neutro Abs: 5899 cells/uL (ref 1500–7800)
Neutrophils Relative %: 67.8 %
PLATELETS: 254 10*3/uL (ref 140–400)
RBC: 5.46 10*6/uL (ref 4.20–5.80)
RDW: 12.2 % (ref 11.0–15.0)
TOTAL LYMPHOCYTE: 25.7 %
WBC mixed population: 496 cells/uL (ref 200–950)
WBC: 8.7 10*3/uL (ref 3.8–10.8)

## 2017-06-19 LAB — TSH+FREE T4: TSH W/REFLEX TO FT4: 0.7 mIU/L (ref 0.40–4.50)

## 2017-06-19 LAB — B. BURGDORFI ANTIBODIES

## 2017-06-19 LAB — VITAMIN B12: VITAMIN B 12: 1247 pg/mL — AB (ref 200–1100)

## 2017-06-19 LAB — CK: CK TOTAL: 187 U/L (ref 44–196)

## 2017-06-19 LAB — LACTATE DEHYDROGENASE: LDH: 151 U/L (ref 100–220)

## 2017-06-19 LAB — SEDIMENTATION RATE: SED RATE: 2 mm/h (ref 0–15)

## 2017-06-20 ENCOUNTER — Encounter: Payer: Self-pay | Admitting: Physician Assistant

## 2017-06-20 NOTE — Progress Notes (Signed)
Good afternoon Eugene Coleman,  All of your lab work came back normal.  The lyme antibodies were both negative. Your B12 level is actually elevated. Your inflammatory markers were in a normal range.  The next step would be to refer you to neurology to work you up for possible peripheral neuropathy. Would you like to do that?  Evlyn Clines

## 2017-06-24 DIAGNOSIS — R200 Anesthesia of skin: Secondary | ICD-10-CM | POA: Diagnosis not present

## 2017-06-24 DIAGNOSIS — R5383 Other fatigue: Secondary | ICD-10-CM | POA: Diagnosis not present

## 2017-06-24 DIAGNOSIS — R202 Paresthesia of skin: Secondary | ICD-10-CM | POA: Diagnosis not present

## 2017-06-24 DIAGNOSIS — E569 Vitamin deficiency, unspecified: Secondary | ICD-10-CM | POA: Diagnosis not present

## 2017-06-25 ENCOUNTER — Ambulatory Visit: Payer: No Typology Code available for payment source | Admitting: Physician Assistant

## 2017-06-25 ENCOUNTER — Encounter: Payer: BLUE CROSS/BLUE SHIELD | Admitting: Gastroenterology

## 2017-06-25 DIAGNOSIS — Z131 Encounter for screening for diabetes mellitus: Secondary | ICD-10-CM | POA: Diagnosis not present

## 2017-06-25 DIAGNOSIS — R5383 Other fatigue: Secondary | ICD-10-CM | POA: Diagnosis not present

## 2017-06-25 DIAGNOSIS — E559 Vitamin D deficiency, unspecified: Secondary | ICD-10-CM | POA: Diagnosis not present

## 2017-06-25 DIAGNOSIS — E569 Vitamin deficiency, unspecified: Secondary | ICD-10-CM | POA: Diagnosis not present

## 2017-06-25 DIAGNOSIS — R6882 Decreased libido: Secondary | ICD-10-CM | POA: Diagnosis not present

## 2017-06-25 DIAGNOSIS — Z77120 Contact with and (suspected) exposure to mold (toxic): Secondary | ICD-10-CM | POA: Diagnosis not present

## 2017-06-25 DIAGNOSIS — R200 Anesthesia of skin: Secondary | ICD-10-CM | POA: Diagnosis not present

## 2017-06-25 DIAGNOSIS — R531 Weakness: Secondary | ICD-10-CM | POA: Diagnosis not present

## 2017-06-25 DIAGNOSIS — E039 Hypothyroidism, unspecified: Secondary | ICD-10-CM | POA: Diagnosis not present

## 2017-06-29 DIAGNOSIS — R0602 Shortness of breath: Secondary | ICD-10-CM | POA: Diagnosis not present

## 2017-06-29 DIAGNOSIS — E785 Hyperlipidemia, unspecified: Secondary | ICD-10-CM | POA: Diagnosis not present

## 2017-06-29 DIAGNOSIS — R0600 Dyspnea, unspecified: Secondary | ICD-10-CM | POA: Diagnosis not present

## 2017-06-29 DIAGNOSIS — E7800 Pure hypercholesterolemia, unspecified: Secondary | ICD-10-CM | POA: Diagnosis not present

## 2017-06-29 DIAGNOSIS — F419 Anxiety disorder, unspecified: Secondary | ICD-10-CM | POA: Diagnosis not present

## 2017-06-29 DIAGNOSIS — R202 Paresthesia of skin: Secondary | ICD-10-CM | POA: Diagnosis not present

## 2017-06-30 ENCOUNTER — Ambulatory Visit (HOSPITAL_COMMUNITY)
Admission: RE | Admit: 2017-06-30 | Discharge: 2017-06-30 | Disposition: A | Discharge status: Home / Self Care | Payer: BLUE CROSS/BLUE SHIELD | Source: Ambulatory Visit | Attending: Gastroenterology | Admitting: Gastroenterology

## 2017-06-30 ENCOUNTER — Telehealth: Payer: Self-pay | Admitting: Gastroenterology

## 2017-06-30 DIAGNOSIS — R1011 Right upper quadrant pain: Secondary | ICD-10-CM | POA: Diagnosis not present

## 2017-06-30 DIAGNOSIS — K219 Gastro-esophageal reflux disease without esophagitis: Secondary | ICD-10-CM | POA: Insufficient documentation

## 2017-06-30 NOTE — Telephone Encounter (Signed)
I returned his call. Pt does not want sedation unless he has UGI indicated a very serious condition. UGI did not see a mass or ulcer. He has noted symptom improvement on IBgard however he still has epigastric bloating and epigastric pain.  He did not try dicyclomine.  Advised a trial of FDgard or FDgard plus IBgard.  If symptoms are not adequately controlled within the next few weeks he is advised to call or schedule an office appointment.

## 2017-06-30 NOTE — Telephone Encounter (Signed)
Patient asking to speak with Dr/ Fuller Plan directly for alternatives to EGD.  Does not want sedation.

## 2017-07-01 ENCOUNTER — Encounter: Payer: Self-pay | Admitting: Physician Assistant

## 2017-07-01 ENCOUNTER — Ambulatory Visit (INDEPENDENT_AMBULATORY_CARE_PROVIDER_SITE_OTHER): Payer: BLUE CROSS/BLUE SHIELD | Admitting: Physician Assistant

## 2017-07-01 VITALS — BP 157/96 | HR 65 | Wt 242.0 lb

## 2017-07-01 DIAGNOSIS — G4719 Other hypersomnia: Secondary | ICD-10-CM

## 2017-07-01 DIAGNOSIS — F410 Panic disorder [episodic paroxysmal anxiety] without agoraphobia: Secondary | ICD-10-CM | POA: Diagnosis not present

## 2017-07-01 DIAGNOSIS — F418 Other specified anxiety disorders: Secondary | ICD-10-CM

## 2017-07-01 DIAGNOSIS — M62838 Other muscle spasm: Secondary | ICD-10-CM

## 2017-07-01 DIAGNOSIS — Z9189 Other specified personal risk factors, not elsewhere classified: Secondary | ICD-10-CM

## 2017-07-01 DIAGNOSIS — F411 Generalized anxiety disorder: Secondary | ICD-10-CM

## 2017-07-01 MED ORDER — ALPRAZOLAM 0.5 MG PO TABS: 0.5000 mg | ORAL_TABLET | Freq: Three times a day (TID) | ORAL | 0 refills | Status: DC | PRN

## 2017-07-01 MED ORDER — METHOCARBAMOL 500 MG PO TABS: 500.0000 mg | ORAL_TABLET | Freq: Every evening | ORAL | 1 refills | Status: DC | PRN

## 2017-07-01 NOTE — Patient Instructions (Signed)

## 2017-07-01 NOTE — Telephone Encounter (Signed)
EGD cancelled.

## 2017-07-01 NOTE — Progress Notes (Signed)
HPI:                                                                Eugene Coleman is a 47 y.o. male who presents to [place]: [facility] today for anxiety  Pleasant 47 yo M with PMH of GAD, hypogonadism, HTN who presents today with worsening anxiety symptoms, especially neck tightness and panic at night. He has anxiety related to falling asleep because he feels that he is not breathing and has to wake himself up to take a breath. Sleep is fragmented and non-restorative. Has been waking up nightly gasping for air and having palpitations. He has never had a sleep study.   He has been off of Paxil for about 6 months. Has been exercising twice a day, practicing relaxation exercises, drinking chamomile tea, avoiding caffeine but this no longer working for him.   He feels his history of Paxil has caused damage to his nervous system and is the cause of these symptoms.  Depression screen [facility] 2/9 07/01/2017 04/25/2017  Decreased Interest 3 0  Down, Depressed, Hopeless 1 0  PHQ - 2 Score 4 0  Altered sleeping 3 -  Tired, decreased energy 3 0  Change in appetite 0 0  Feeling bad or failure about yourself  1 0  Trouble concentrating 2 0  Moving slowly or fidgety/restless 0 0  Suicidal thoughts 0 0  PHQ-9 Score 13 -    GAD 7 : Generalized Anxiety Score 07/01/2017  Nervous, Anxious, on Edge 3  Control/stop worrying 3  Worry too much - different things 3  Trouble relaxing 3  Restless 3  Easily annoyed or irritable 3  Afraid - awful might happen 3  Total GAD 7 Score 21      Past Medical History:  Diagnosis Date  . Allergy   . Generalized anxiety disorder 11/06/2012  . Hyperlipidemia   . Hypertension   . 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 KNEE SURGERY Bilateral [phone]   left x 2, right x 1  . TONSILLECTOMY    . wisdow  teeth     childhood   Social History   Tobacco Use  . Smoking status: Never Smoker  . Smokeless tobacco: Former Network engineer Use Topics  . Alcohol use: No   family history includes Breast cancer in his mother; Diverticulitis in his mother; Heart attack in his maternal grandfather and paternal grandfather; Hypertension in his father; Leukemia in his father; Lupus in his sister; Stroke in his paternal grandfather; Thyroid disease in his cousin.    ROS: negative except as noted in the HPI  Medications: Current Outpatient Medications  Medication Sig Dispense Refill  . pantoprazole (PROTONIX) 20 MG tablet Take 1 tablet (20 mg total) by mouth daily. 30 tablet 0  . Peppermint Oil 90 MG CPCR Take by mouth.    . sucralfate (CARAFATE) 1 GM/10ML suspension Take 10 mLs (1 g total) by mouth 4 (four) times daily -  with meals and at bedtime. 420 mL 0  . ALPRAZolam (XANAX) 0.5 MG tablet Take 1 tablet (0.5 mg total) by mouth every 8 (eight) hours as needed for anxiety.  20 tablet 0  . methocarbamol (ROBAXIN) 500 MG tablet Take 1 tablet (500 mg total) by mouth at bedtime as needed for muscle spasms. 30 tablet 1   No current facility-administered medications for this visit.    Allergies  Allergen Reactions  . Penicillins Rash    Was allergic to it as a child       Objective:  BP (!) 157/96   Pulse 65   Wt 242 lb (109.8 kg)   BMI 30.25 kg/m  Gen:  alert, not ill-appearing, no distress, appropriate for age, obese male HEENT: head normocephalic without obvious abnormality, conjunctiva and cornea clear, trachea midline Pulm: Normal work of breathing, normal phonation Neuro: alert and oriented x 3, no tremor MSK: extremities atraumatic, normal gait and station Skin: intact, no rashes on exposed skin, no jaundice, no cyanosis Psych: well-groomed, cooperative, good eye contact, appears anxious, mild psychomotor agitation, tearful, affect full range, speech is articulate, and thought  processes clear and goal-directed    No results found for this or any previous visit (from the past 72 hour(s)).     Assessment and Plan: 47 y.o. male with   GAD/depression - PHQ9=13, moderate, no acute safety issues; GAD7=21, severe - he declines re-starting Paxil. I think he would benefit from an  SSRI, but he is fairly determined to manage this without medication. He does want to re-start Xanax - provided with list of [facility]-based therapists in his area and encouraged him to schedule - short-term use of Xanax for panic attacks  At risk of sleep apnea - +STOPBANG (+apnea, +male, +HTN, +tired) - self-reported apnea, PND and palpitations. Recommended sleep study to further evaluate - avoid sedatives at night pending sleep study  Generalized anxiety disorder with panic attacks - Plan: ALPRAZolam (XANAX) 0.5 MG tablet  Excessive daytime sleepiness - Plan: Home sleep test  At risk for sleep apnea - Plan: Home sleep test  Depression with anxiety  Cervical paraspinal muscle spasm - Plan: methocarbamol (ROBAXIN) 500 MG tablet   Patient education and anticipatory guidance given Patient agrees with treatment plan Follow-up as needed if symptoms worsen or fail to improve  I spent 40 minutes with this patient, greater than 50% was face-to-face time counseling regarding the above diagnoses   Darlyne Russian PA-C

## 2017-07-02 ENCOUNTER — Encounter: Payer: BLUE CROSS/BLUE SHIELD | Admitting: Gastroenterology

## 2017-07-02 ENCOUNTER — Encounter: Payer: Self-pay | Admitting: Physician Assistant

## 2017-07-03 DIAGNOSIS — Z9189 Other specified personal risk factors, not elsewhere classified: Secondary | ICD-10-CM | POA: Insufficient documentation

## 2017-07-03 DIAGNOSIS — M62838 Other muscle spasm: Secondary | ICD-10-CM | POA: Insufficient documentation

## 2017-07-03 DIAGNOSIS — F418 Other specified anxiety disorders: Secondary | ICD-10-CM | POA: Insufficient documentation

## 2017-07-03 DIAGNOSIS — G4719 Other hypersomnia: Secondary | ICD-10-CM | POA: Insufficient documentation

## 2017-07-03 HISTORY — DX: Other specified anxiety disorders: F41.8

## 2017-07-08 ENCOUNTER — Ambulatory Visit (INDEPENDENT_AMBULATORY_CARE_PROVIDER_SITE_OTHER): Payer: BLUE CROSS/BLUE SHIELD | Admitting: Physician Assistant

## 2017-07-08 ENCOUNTER — Encounter: Payer: Self-pay | Admitting: Physician Assistant

## 2017-07-08 VITALS — BP 149/90 | HR 73 | Resp 14 | Wt 243.0 lb

## 2017-07-08 DIAGNOSIS — R531 Weakness: Secondary | ICD-10-CM | POA: Diagnosis not present

## 2017-07-08 DIAGNOSIS — F411 Generalized anxiety disorder: Secondary | ICD-10-CM | POA: Diagnosis not present

## 2017-07-08 DIAGNOSIS — I998 Other disorder of circulatory system: Secondary | ICD-10-CM

## 2017-07-08 DIAGNOSIS — Z77120 Contact with and (suspected) exposure to mold (toxic): Secondary | ICD-10-CM | POA: Diagnosis not present

## 2017-07-08 DIAGNOSIS — I10 Essential (primary) hypertension: Secondary | ICD-10-CM

## 2017-07-08 DIAGNOSIS — R5383 Other fatigue: Secondary | ICD-10-CM | POA: Diagnosis not present

## 2017-07-08 DIAGNOSIS — F410 Panic disorder [episodic paroxysmal anxiety] without agoraphobia: Secondary | ICD-10-CM | POA: Diagnosis not present

## 2017-07-08 DIAGNOSIS — R002 Palpitations: Secondary | ICD-10-CM

## 2017-07-08 DIAGNOSIS — E039 Hypothyroidism, unspecified: Secondary | ICD-10-CM | POA: Diagnosis not present

## 2017-07-08 MED ORDER — ALPRAZOLAM 0.5 MG PO TABS: 0.5000 mg | ORAL_TABLET | Freq: Three times a day (TID) | ORAL | 2 refills | Status: DC | PRN

## 2017-07-08 MED ORDER — AMLODIPINE BESYLATE 5 MG PO TABS: 5.0000 mg | ORAL_TABLET | Freq: Every day | ORAL | 5 refills | Status: DC

## 2017-07-08 NOTE — Progress Notes (Signed)
HPI:                                                                Eugene Coleman is a 47 y.o. male who presents to [facility] [place] today for anxiety follow-up  Interval history: patient here for 1 week follow-up for GAD and panic attacks. He is currently using Xanax 0.25 mg 2-3 times daily as needed. He feels this has helped him significantly. He feels like the Robaxin worsened his anxiety and muscle tension, so he has stopped this.  He has recently restarted TRT (100 mg weekly) and is followed by [facility]. They are also treating him with a number of supplements for mood and energy.  As far as hypertension, he has been monitoring his BP at home and states his morning readings are typically 120's/70's. He has had multiple readings in the 130's-140's/80's that he notes occur when he is feeling anxious or irritable. Reports he has intermittent heart palpitations. He has also noticed a discrepency in his systolic blood pressures of more than 10 points in each arm and left arm is always lower. He feels a lot of neck tension and paresthesias on the left side as well.    Depression screen [facility] 2/9 07/08/2017 07/01/2017 04/25/2017  Decreased Interest 0 3 0  Down, Depressed, Hopeless 1 1 0  PHQ - 2 Score 1 4 0  Altered sleeping 1 3 -  Tired, decreased energy 2 3 0  Change in appetite 0 0 0  Feeling bad or failure about yourself  0 1 0  Trouble concentrating 0 2 0  Moving slowly or fidgety/restless 0 0 0  Suicidal thoughts 0 0 0  PHQ-9 Score 4 13 -    GAD 7 : Generalized Anxiety Score 07/08/2017 07/01/2017  Nervous, Anxious, on Edge 1 3  Control/stop worrying 1 3  Worry too much - different things 1 3  Trouble relaxing 2 3  Restless 0 3  Easily annoyed or irritable 0 3  Afraid - awful might happen 1 3  Total GAD 7 Score 6 21      Past Medical History:  Diagnosis Date  . Allergy   . Generalized anxiety disorder 11/06/2012  .  Hyperlipidemia   . Hypertension   . 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 KNEE SURGERY Bilateral [phone]   left x 2, right x 1  . TONSILLECTOMY    . wisdow teeth     childhood   Social History   Tobacco Use  . Smoking status: Never Smoker  . Smokeless tobacco: Former Network engineer Use Topics  . Alcohol use: No   family history includes Breast cancer in his mother; Diverticulitis in his mother; Heart attack in his maternal grandfather and paternal grandfather; Hypertension in his father; Leukemia in his father; Lupus in his sister; Stroke in his paternal grandfather; Thyroid disease in his cousin.    ROS: negative except as noted in the HPI  Medications: Current Outpatient Medications  Medication Sig Dispense Refill  . [START ON 07/12/2017] ALPRAZolam (XANAX) 0.5 MG tablet Take 1 tablet (0.5 mg total) by mouth every 8 (eight) hours as  needed for anxiety. 20 tablet 2  . cholecalciferol (VITAMIN D) 1000 units tablet Take 7,500 Units by mouth daily.    . Cobalamine Combinations (METHYL-MAX PO) Take by mouth.    Marland Kitchen L-Theanine 100 MG CAPS Take 200 mg by mouth 2 (two) times daily before lunch and supper.    . Melatonin 5 MG CAPS Take 5 mg by mouth at bedtime.    . Menaquinone-7 (VITAMIN K2 PO) Take 150 mcg by mouth daily.    . pantoprazole (PROTONIX) 20 MG tablet Take 1 tablet (20 mg total) by mouth daily. 30 tablet 0  . Peppermint Oil 90 MG CPCR Take by mouth.    . Phosphatidylserine 100 MG CAPS Take 200 mg by mouth 2 (two) times daily before lunch and supper.    . testosterone cypionate (DEPOTESTOTERONE CYPIONATE) 100 MG/ML injection Inject 1 mL into the muscle once a week. For IM use only    . amLODipine (NORVASC) 5 MG tablet Take 1 tablet (5 mg total) by mouth daily. 30 tablet 5   No current facility-administered medications for this visit.    Allergies   Allergen Reactions  . Penicillins Rash    Was allergic to it as a child       Objective:  BP (!) 149/90 (BP Location: Right Arm)   Pulse 73   Resp 14   Wt 243 lb (110.2 kg)   BMI 30.37 kg/m  Gen:  alert, not ill-appearing, no distress, appropriate for age, obese male HEENT: head normocephalic without obvious abnormality, conjunctiva and cornea clear, trachea midline Pulm: Normal work of breathing, normal phonation Neuro: alert and oriented x 3, no tremor MSK: extremities atraumatic, normal gait and station Skin: intact, no rashes on exposed skin, no jaundice, no cyanosis Psych: well-groomed, cooperative, good eye contact, appears anxious, mild psychomotor agitation, tearful, affect full range, speech is articulate, and thought processes clear and goal-directed    No results found for this or any previous visit (from the past 72 hour(s)).     Assessment and Plan: 47 y.o. male with   GAD/depression - improving with benzodiazepine and testosterone replacement. Declines SSRI/SNRI at this time - provided with list of [facility]-based therapists in his area and encouraged him to schedule - short-term use of Xanax for panic attacks  HTN, Asymmetric blood pressures BP Readings from Last 3 Encounters:  07/08/17 (!) 149/90  07/01/17 (!) 157/96  06/18/17 (!) 144/93  - he is amenable to starting a calcium channel blocker - encouraged to continue therapeutic lifestyle changes - CT angio thoracic aorta to r/o subclavian stenosis due to BP asymmetry    Generalized anxiety disorder with panic attacks - Plan: ALPRAZolam (XANAX) 0.5 MG tablet  Hypertension goal BP (blood pressure) < 130/80 - Plan: amLODipine (NORVASC) 5 MG tablet  Asymmetric blood pressures - Plan: CT ANGIO CHEST AORTA W &/OR WO CONTRAST  Heart palpitations  Patient education and anticipatory guidance given Patient agrees with treatment plan Follow-up in 2 weeks or sooner as needed if symptoms worsen or fail  to improve     Darlyne Russian PA-C

## 2017-07-08 NOTE — Patient Instructions (Addendum)
For your blood pressure: - Goal <130/80 - start Amlodipine 5 mg - monitor and log blood pressures at home - check around the same time each day in a relaxed setting - Limit salt to <2000 mg/day - Follow DASH eating plan - limit alcohol to 2 standard drinks per day for men and 1 per day for women - avoid tobacco products - weight loss: 7% of current body weight - follow-up every 6 months for your blood pressure    Resources for Coping with Anxiety and Depression - https://www.[place].net/ - 7cupsoftea - ArmyDictionary.fi - mindbodygreen.com  Counseling: - psychologytoday.com: search engine to locate local counselors - Family Services in your county offer counseling on a sliding scale (pay what you can afford) - [place]: we can place a referral for you to see one of licensed counselors in [place], [place], or [place] - online counseling: [place] and [facility] (not covered by insurance, but affordable self-pay rates)  Other resources: - https://www.[place].net/ - 7cupsoftea - ArmyDictionary.fi  Safety Plan: if having self-harm or suicidal thoughts Our Office [place] 1-800-SUICIDE If in immediate danger of harming yourself, go to the nearest emergency room or call 911

## 2017-07-10 ENCOUNTER — Encounter: Payer: Self-pay | Admitting: Physician Assistant

## 2017-07-10 ENCOUNTER — Ambulatory Visit (INDEPENDENT_AMBULATORY_CARE_PROVIDER_SITE_OTHER): Payer: BLUE CROSS/BLUE SHIELD

## 2017-07-10 DIAGNOSIS — E041 Nontoxic single thyroid nodule: Secondary | ICD-10-CM

## 2017-07-10 DIAGNOSIS — R200 Anesthesia of skin: Secondary | ICD-10-CM | POA: Diagnosis not present

## 2017-07-10 DIAGNOSIS — E042 Nontoxic multinodular goiter: Secondary | ICD-10-CM | POA: Insufficient documentation

## 2017-07-10 DIAGNOSIS — R202 Paresthesia of skin: Secondary | ICD-10-CM | POA: Diagnosis not present

## 2017-07-10 DIAGNOSIS — I998 Other disorder of circulatory system: Secondary | ICD-10-CM

## 2017-07-10 DIAGNOSIS — R030 Elevated blood-pressure reading, without diagnosis of hypertension: Secondary | ICD-10-CM | POA: Diagnosis not present

## 2017-07-10 HISTORY — DX: Nontoxic multinodular goiter: E04.2

## 2017-07-10 HISTORY — DX: Nontoxic single thyroid nodule: E04.1

## 2017-07-10 MED ORDER — IOPAMIDOL (ISOVUE-370) INJECTION 76%
100.0000 mL | Freq: Once | INTRAVENOUS | Status: AC | PRN
  Administered 2017-07-10: 100 mL via INTRAVENOUS

## 2017-07-14 ENCOUNTER — Encounter: Payer: Self-pay | Admitting: Physician Assistant

## 2017-07-14 ENCOUNTER — Other Ambulatory Visit: Payer: Self-pay | Admitting: Physician Assistant

## 2017-07-14 DIAGNOSIS — G4730 Sleep apnea, unspecified: Secondary | ICD-10-CM | POA: Diagnosis not present

## 2017-07-14 DIAGNOSIS — F4322 Adjustment disorder with anxiety: Secondary | ICD-10-CM | POA: Diagnosis not present

## 2017-07-14 DIAGNOSIS — E291 Testicular hypofunction: Secondary | ICD-10-CM

## 2017-07-14 NOTE — Progress Notes (Signed)
refer

## 2017-07-17 ENCOUNTER — Emergency Department (HOSPITAL_COMMUNITY): Payer: BLUE CROSS/BLUE SHIELD

## 2017-07-17 ENCOUNTER — Encounter: Payer: Self-pay | Admitting: Physician Assistant

## 2017-07-17 ENCOUNTER — Encounter (HOSPITAL_COMMUNITY): Payer: Self-pay | Admitting: Emergency Medicine

## 2017-07-17 ENCOUNTER — Emergency Department (HOSPITAL_COMMUNITY)
Admission: EM | Admit: 2017-07-17 | Discharge: 2017-07-17 | Disposition: A | Discharge status: Home / Self Care | Payer: BLUE CROSS/BLUE SHIELD | Attending: Emergency Medicine | Admitting: Emergency Medicine

## 2017-07-17 ENCOUNTER — Ambulatory Visit (INDEPENDENT_AMBULATORY_CARE_PROVIDER_SITE_OTHER): Payer: BLUE CROSS/BLUE SHIELD

## 2017-07-17 DIAGNOSIS — R202 Paresthesia of skin: Secondary | ICD-10-CM | POA: Diagnosis not present

## 2017-07-17 DIAGNOSIS — E042 Nontoxic multinodular goiter: Secondary | ICD-10-CM

## 2017-07-17 DIAGNOSIS — E041 Nontoxic single thyroid nodule: Secondary | ICD-10-CM

## 2017-07-17 DIAGNOSIS — R42 Dizziness and giddiness: Secondary | ICD-10-CM | POA: Insufficient documentation

## 2017-07-17 DIAGNOSIS — I10 Essential (primary) hypertension: Secondary | ICD-10-CM | POA: Diagnosis not present

## 2017-07-17 DIAGNOSIS — Z79899 Other long term (current) drug therapy: Secondary | ICD-10-CM | POA: Insufficient documentation

## 2017-07-17 LAB — DIFFERENTIAL
BASOS ABS: 0 10*3/uL (ref 0.0–0.1)
Basophils Relative: 0 %
Eosinophils Absolute: 0 10*3/uL (ref 0.0–0.7)
Eosinophils Relative: 0 %
LYMPHS ABS: 2 10*3/uL (ref 0.7–4.0)
LYMPHS PCT: 23 %
MONOS PCT: 4 %
Monocytes Absolute: 0.3 10*3/uL (ref 0.1–1.0)
NEUTROS ABS: 6.1 10*3/uL (ref 1.7–7.7)
Neutrophils Relative %: 73 %

## 2017-07-17 LAB — I-STAT CHEM 8, ED
BUN: 7 mg/dL (ref 6–20)
CALCIUM ION: 1.15 mmol/L (ref 1.15–1.40)
CHLORIDE: 103 mmol/L (ref 101–111)
CREATININE: 0.8 mg/dL (ref 0.61–1.24)
GLUCOSE: 91 mg/dL (ref 65–99)
HCT: 48 % (ref 39.0–52.0)
HEMOGLOBIN: 16.3 g/dL (ref 13.0–17.0)
POTASSIUM: 3.6 mmol/L (ref 3.5–5.1)
Sodium: 141 mmol/L (ref 135–145)
TCO2: 23 mmol/L (ref 22–32)

## 2017-07-17 LAB — COMPREHENSIVE METABOLIC PANEL
ALK PHOS: 46 U/L (ref 38–126)
ALT: 42 U/L (ref 17–63)
AST: 25 U/L (ref 15–41)
Albumin: 4.1 g/dL (ref 3.5–5.0)
Anion gap: 13 (ref 5–15)
BUN: 7 mg/dL (ref 6–20)
CHLORIDE: 105 mmol/L (ref 101–111)
CO2: 22 mmol/L (ref 22–32)
CREATININE: 0.96 mg/dL (ref 0.61–1.24)
Calcium: 9.4 mg/dL (ref 8.9–10.3)
GFR calc Af Amer: >60 mL/min (ref >60)
Glucose, Bld: 92 mg/dL (ref 65–99)
Potassium: 3.7 mmol/L (ref 3.5–5.1)
Sodium: 140 mmol/L (ref 135–145)
Total Bilirubin: 1.1 mg/dL (ref 0.3–1.2)
Total Protein: 6.4 g/dL — ABNORMAL LOW (ref 6.5–8.1)

## 2017-07-17 LAB — I-STAT TROPONIN, ED: TROPONIN I, POC: 0.01 ng/mL (ref 0.00–0.08)

## 2017-07-17 LAB — CBC
HEMATOCRIT: 46.5 % (ref 39.0–52.0)
HEMOGLOBIN: 15.7 g/dL (ref 13.0–17.0)
MCH: 30.3 pg (ref 26.0–34.0)
MCHC: 33.8 g/dL (ref 30.0–36.0)
MCV: 89.8 fL (ref 78.0–100.0)
Platelets: 258 10*3/uL (ref 150–400)
RBC: 5.18 MIL/uL (ref 4.22–5.81)
RDW: 13.2 % (ref 11.5–15.5)
WBC: 8.4 10*3/uL (ref 4.0–10.5)

## 2017-07-17 LAB — APTT: APTT: 30 s (ref 24–36)

## 2017-07-17 LAB — PROTIME-INR
INR: 1.15
Prothrombin Time: 14.6 seconds (ref 11.4–15.2)

## 2017-07-17 NOTE — Discharge Instructions (Addendum)
Keep your appointment with your follow-up doctors.

## 2017-07-17 NOTE — ED Provider Notes (Signed)
[facility] EMERGENCY DEPARTMENT Provider Note   CSN: [id number] Arrival date & time: 07/17/17  1051     History   Chief Complaint Chief Complaint  Patient presents with  . Dizziness    HPI Eugene Coleman is a 47 y.o. male.  47 year old male presents with greater than 56-month history of sleep apnea with some associated bilateral arm paresthesias.  Symptoms are worse when he wakes up secondary to his sleep apnea.  They resolved once he is able to be awake for a while and calm down.  He also admits to history of anxiety but does not like taking his Xanax.  He does take his Xanax, it does make his symptoms better.  Complains of paracervical neck pain radiating to his scalp.  No bowel or bladder dysfunction.  No persistent weakness.  Came in today because he felt that his symptoms were somewhat worse.     Past Medical History:  Diagnosis Date  . Allergy   . Generalized anxiety disorder 11/06/2012  . Hyperlipidemia   . Hypertension   . Multiple thyroid nodules 07/10/2017  . 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 Right thyroid nodule 07/10/2017    Patient Active Problem List   Diagnosis Date Noted  . Multiple thyroid nodules 07/10/2017  . Asymmetric blood pressures 07/08/2017  . Heart palpitations 07/08/2017  . Cervical paraspinal muscle spasm 07/03/2017  . Depression with anxiety 07/03/2017  . Excessive daytime sleepiness 07/03/2017  . At risk for sleep apnea 07/03/2017  . Hypertension goal BP (blood pressure) < 130/80 06/18/2017  . Risk of exposure to Lyme disease 06/18/2017  . 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 Paresthesias/numbness 06/18/2017  . 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 Rash and nonspecific skin eruption 04/25/2017  . Family history of autoimmune disorder 04/25/2017  . Fatigue 04/25/2017  .  Family history of inflammatory bowel disease 04/25/2017  . 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 GERD (gastroesophageal reflux disease) 12/09/2012  . Essential hypertension, benign 11/06/2012  . Generalized anxiety disorder with panic attacks 11/06/2012  . Right sided abdominal pain 01/08/2012    Past Surgical History:  Procedure Laterality Date  . KNEE SURGERY Bilateral [phone]   left x 2, right x 1  . TONSILLECTOMY    . wisdow teeth     childhood        Home Medications    Prior to Admission medications   Medication Sig Start Date End Date Taking? Authorizing Provider  ALPRAZolam Duanne Moron) 0.5 MG tablet Take 1 tablet (0.5 mg total) by mouth every 8 (eight) hours as needed for anxiety. 07/12/17   Trixie Dredge, PA-C  amLODipine (NORVASC) 5 MG tablet Take 1 tablet (5 mg total) by mouth daily. 07/08/17   Trixie Dredge, PA-C  cholecalciferol (VITAMIN D) 1000 units tablet Take 7,500 Units by mouth daily.    [provider]  Cobalamine Combinations (METHYL-MAX PO) Take by mouth.    [provider]  L-Theanine 100 MG CAPS Take 200 mg by mouth 2 (two) times daily before lunch and supper.    [provider]  Melatonin 5 MG CAPS Take 5 mg by mouth at bedtime.    [provider]  Menaquinone-7 (VITAMIN K2 PO) Take 150 mcg by mouth daily.    [provider]  pantoprazole (PROTONIX) 20 MG  tablet Take 1 tablet (20 mg total) by mouth daily. 06/12/17   Pisciotta, Elmyra Ricks, PA-C  Peppermint Oil 90 MG CPCR Take by mouth.    [provider]  Phosphatidylserine 100 MG CAPS Take 200 mg by mouth 2 (two) times daily before lunch and supper.    [provider]  testosterone cypionate (DEPOTESTOSTERONE CYPIONATE) 200 MG/ML injection Inject 1 mL into the muscle every 7 (seven) days. 07/08/17   [provider]  testosterone cypionate  (DEPOTESTOTERONE CYPIONATE) 100 MG/ML injection Inject 1 mL into the muscle once a week. For IM use only    [provider]    Family History Family History  Problem Relation Age of Onset  . Breast cancer Mother   . Diverticulitis Mother   . Leukemia Father   . Hypertension Father   . Lupus Sister   . Thyroid disease Cousin   . Heart attack Maternal Grandfather   . Heart attack Paternal Grandfather   . Stroke Paternal Grandfather   . Colon cancer Neg Hx   . Colon polyps Neg Hx   . Esophageal cancer Neg Hx   . Rectal cancer Neg Hx     Social History Social History   Tobacco Use  . Smoking status: Never Smoker  . Smokeless tobacco: Former Network engineer Use Topics  . Alcohol use: No  . Drug use: No     Allergies   Penicillins   Review of Systems Review of Systems  All other systems reviewed and are negative.    Physical Exam Updated Vital Signs BP (!) 138/102 (BP Location: Right Arm)   Pulse (!) 59   Temp 98.1 F (36.7 C) (Oral)   Resp 16   SpO2 98%   Physical Exam  Constitutional: He is oriented to person, place, and time. He appears well-developed and well-nourished.  Non-toxic appearance. No distress.  HENT:  Head: Normocephalic and atraumatic.  Eyes: Pupils are equal, round, and reactive to light. Conjunctivae, EOM and lids are normal.  Neck: Normal range of motion. Neck supple. No tracheal deviation present. No thyroid mass present.  Cardiovascular: Normal rate, regular rhythm and normal heart sounds. Exam reveals no gallop.  No murmur heard. Pulmonary/Chest: Effort normal and breath sounds normal. No stridor. No respiratory distress. He has no decreased breath sounds. He has no wheezes. He has no rhonchi. He has no rales.  Abdominal: Soft. Normal appearance and bowel sounds are normal. He exhibits no distension. There is no tenderness. There is no rebound and no CVA tenderness.  Musculoskeletal: Normal range of motion. He exhibits no edema  or tenderness.  Neurological: He is alert and oriented to person, place, and time. He has normal strength. No cranial nerve deficit or sensory deficit. He displays a negative Romberg sign. GCS eye subscore is 4. GCS verbal subscore is 5. GCS motor subscore is 6.  Skin: Skin is warm and dry. No abrasion and no rash noted.  Psychiatric: He has a normal mood and affect. His speech is normal and behavior is normal.  Nursing note and vitals reviewed.    ED Treatments / Results  Labs (all labs ordered are listed, but only abnormal results are displayed) Labs Reviewed  COMPREHENSIVE METABOLIC PANEL - Abnormal; Notable for the following components:      Result Value   Total Protein 6.4 (*)    All other components within normal limits  PROTIME-INR  APTT  CBC  DIFFERENTIAL  I-STAT TROPONIN, ED  I-STAT CHEM 8, ED  CBG MONITORING, ED    EKG None  Radiology Ct Head Wo Contrast  Result Date: 07/17/2017 CLINICAL DATA:  Right arm numbness for 3 months. EXAM: CT HEAD WITHOUT CONTRAST TECHNIQUE: Contiguous axial images were obtained from the base of the skull through the vertex without intravenous contrast. COMPARISON:  Head CT and brain MRI 04/02/2017. FINDINGS: Brain: No evidence of acute infarction, hemorrhage, hydrocephalus, extra-axial collection or mass lesion/mass effect. Vascular: No hyperdense vessel or unexpected calcification. Skull: Intact. Sinuses/Orbits: 0.9 cm in diameter mucous retention cyst or polyp right sphenoid sinus incidentally noted. Otherwise negative. Other: None. IMPRESSION: Negative head CT. Electronically Signed   By: Inge Rise M.D.   On: 07/17/2017 12:41   US Thyroid  Result Date: 07/17/2017 CLINICAL DATA:  Incidental on CT. EXAM: THYROID ULTRASOUND TECHNIQUE: Ultrasound examination of the thyroid gland and adjacent soft tissues was performed. COMPARISON:  None. FINDINGS: Parenchymal Echotexture: Moderately heterogenous Isthmus: 0.4 cm Right lobe: 6.2 x 1.7 x 2.0  cm Left lobe: 6.6 x 2.2 x 1.9 cm _________________________________________________________ Estimated total number of nodules >/= 1 cm: 1 Number of spongiform nodules >/=  2 cm not described below (TR1): 0 Number of mixed cystic and solid nodules >/= 1.5 cm not described below (TR2): 0 _________________________________________________________ Nodule # 1: Location: Left; Inferior Maximum size: 1.8 cm; Other 2 dimensions: 1.3 x 1.3 cm Composition: solid/almost completely solid (2) Echogenicity: hypoechoic (2) Shape: not taller-than-wide (0) Margins: smooth (0) Echogenic foci: macrocalcifications (1) ACR TI-RADS total points: 5. ACR TI-RADS risk category: TR4 (4-6 points). ACR TI-RADS recommendations: **Given size (>/= 1.5 cm) and appearance, fine needle aspiration of this moderately suspicious nodule should be considered based on TI-RADS criteria. _________________________________________________________ IMPRESSION: Left nodule 1 meets criteria for fine needle aspiration biopsy. The above is in keeping with the ACR TI-RADS recommendations - J Am Coll Radiol 2017;14:587-595. Electronically Signed   By: Marybelle Killings M.D.   On: 07/17/2017 11:03    Procedures Procedures (including critical care time)  Medications Ordered in ED Medications - No data to display   Initial Impression / Assessment and Plan / ED Course  I have reviewed the triage vital signs and the nursing notes.  Pertinent labs & imaging results that were available during my care of the patient were reviewed by me and considered in my medical decision making (see chart for details).     Head CT without acute findings.  Patient electrolytes and CBC are also reassuring.  He has no gross focal deficits.  Suspect some of his symptoms are from sleep apnea as well as anxiety.  He did take Xanax prior to arrival which is taken the edge off of his symptoms.  Low suspicion for neuromuscular disorder.  Patient has follow-up scheduled with his physician  for a recent sleep apnea test.  Patient also states that his house tested positive for mold and he has follow-up for that as well.  Final Clinical Impressions(s) / ED Diagnoses   Final diagnoses:  None    ED Discharge Orders    None       Lacretia Leigh, MD 07/17/17 [phone]

## 2017-07-17 NOTE — ED Triage Notes (Signed)
Patient with complaint of upper extremely numbness, tingling that started several months ago. Came in today because two ago he started feeling chest palpitations after exercise. Also completed sleep study recently for sleep apnea but states he does not know the result of that evaluation. No other complaints at this time. Patient ambulatory in triage independently without difficulty.

## 2017-07-17 NOTE — Progress Notes (Signed)
Good morning Eugene Coleman,  Your ultrasound did show one nodule on your left thyroid lobe that meets criteria for a biopsy. I have ordered this and you will be contacted to schedule. I am including information for you about a fine needle aspiration biopsy  [email or web address]  Let me know if you have any questions or concerns.  Best, Evlyn Clines

## 2017-07-18 ENCOUNTER — Encounter: Payer: Self-pay | Admitting: Physician Assistant

## 2017-07-22 ENCOUNTER — Ambulatory Visit (INDEPENDENT_AMBULATORY_CARE_PROVIDER_SITE_OTHER): Payer: BLUE CROSS/BLUE SHIELD

## 2017-07-22 ENCOUNTER — Ambulatory Visit (INDEPENDENT_AMBULATORY_CARE_PROVIDER_SITE_OTHER): Payer: BLUE CROSS/BLUE SHIELD | Admitting: Physician Assistant

## 2017-07-22 ENCOUNTER — Encounter: Payer: Self-pay | Admitting: Physician Assistant

## 2017-07-22 VITALS — BP 146/95 | HR 77 | Wt 253.0 lb

## 2017-07-22 DIAGNOSIS — F411 Generalized anxiety disorder: Secondary | ICD-10-CM

## 2017-07-22 DIAGNOSIS — R002 Palpitations: Secondary | ICD-10-CM | POA: Diagnosis not present

## 2017-07-22 DIAGNOSIS — Z1379 Encounter for other screening for genetic and chromosomal anomalies: Secondary | ICD-10-CM

## 2017-07-22 DIAGNOSIS — I10 Essential (primary) hypertension: Secondary | ICD-10-CM

## 2017-07-22 DIAGNOSIS — F410 Panic disorder [episodic paroxysmal anxiety] without agoraphobia: Secondary | ICD-10-CM

## 2017-07-22 DIAGNOSIS — T43225A Adverse effect of selective serotonin reuptake inhibitors, initial encounter: Secondary | ICD-10-CM | POA: Diagnosis not present

## 2017-07-22 DIAGNOSIS — Z9114 Patient's other noncompliance with medication regimen: Secondary | ICD-10-CM | POA: Diagnosis not present

## 2017-07-22 MED ORDER — ALPRAZOLAM 0.5 MG PO TABS: 0.5000 mg | ORAL_TABLET | Freq: Three times a day (TID) | ORAL | 0 refills | Status: DC | PRN

## 2017-07-22 NOTE — Progress Notes (Signed)
HPI:                                                                Eugene Coleman is a 47 y.o. male who presents to [facility] today for anxiety follow-up  Interval history: 47 yo M with PMH of HTN, hypogonadism, GAD, panic attacks presents for follow-up after recent ED visit on 07/17/17 for dizziness, paresthesias and worsening anxiety. He had CT Head w/o contrast 5 days ago, which was negative for acute intracranial process.  Since then, he has self-discontinued all of his medications/supplements because he felt they were causing palpitations/abnormal heart beats and making his symptoms worse. Symptoms are present with exertion. Since stopping these medications palpitations/abnormal beats have resolved. He has an appointment today for cardiac event monitor placement.  He is current taking Xanax 0.5 mg three times per day, 6 am, 12 pm and 6 pm. This is controlling his symptoms well. He is sleeping better. His paresthesias and muscle tension has resolved. He has done some research and is open to trying a different SSRI from Paxil such as Lexapro and Zoloft.    Depression screen [facility] 2/9 07/22/2017 07/08/2017 07/01/2017 04/25/2017  Decreased Interest 0 0 3 0  Down, Depressed, Hopeless 0 1 1 0  PHQ - 2 Score 0 1 4 0  Altered sleeping 0 1 3 -  Tired, decreased energy 1 2 3  0  Change in appetite 1 0 0 0  Feeling bad or failure about yourself  0 0 1 0  Trouble concentrating 0 0 2 0  Moving slowly or fidgety/restless 0 0 0 0  Suicidal thoughts 0 0 0 0  PHQ-9 Score 2 4 13  -    GAD 7 : Generalized Anxiety Score 07/22/2017 07/08/2017 07/01/2017  Nervous, Anxious, on Edge 1 1 3   Control/stop worrying 0 1 3  Worry too much - different things 0 1 3  Trouble relaxing 1 2 3   Restless 0 0 3  Easily annoyed or irritable 1 0 3  Afraid - awful might happen 0 1 3  Total GAD 7 Score 3 6 21       Past Medical History:  Diagnosis Date  . Allergy   .  Generalized anxiety disorder 11/06/2012  . Hyperlipidemia   . Hypertension   . Multiple thyroid nodules 07/10/2017  . 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 Right thyroid nodule 07/10/2017   Past Surgical History:  Procedure Laterality Date  . KNEE SURGERY Bilateral [phone]   left x 2, right x 1  . TONSILLECTOMY    . wisdow teeth     childhood   Social History   Tobacco Use  . Smoking status: Never Smoker  . Smokeless tobacco: Former Network engineer Use Topics  . Alcohol use: No   family history includes Breast cancer in his mother; Diverticulitis in his mother; Heart attack in his maternal grandfather and paternal grandfather; Hypertension in his father; Leukemia in his father; Lupus in his sister; Stroke in his paternal grandfather; Thyroid disease in his cousin.    ROS: negative except as noted in the HPI  Medications: Current Outpatient Medications  Medication Sig Dispense Refill  . ALPRAZolam (XANAX) 0.5 MG  tablet Take 1 tablet (0.5 mg total) by mouth every 8 (eight) hours as needed for anxiety. 20 tablet 2  . Peppermint Oil 90 MG CPCR Take by mouth.    Marland Kitchen amLODipine (NORVASC) 5 MG tablet Take 1 tablet (5 mg total) by mouth daily. (Patient not taking: Reported on 07/22/2017) 30 tablet 5  . cholecalciferol (VITAMIN D) 1000 units tablet Take 7,500 Units by mouth daily.    . Cobalamine Combinations (METHYL-MAX PO) Take by mouth.    Marland Kitchen L-Theanine 100 MG CAPS Take 200 mg by mouth 2 (two) times daily before lunch and supper.    . Melatonin 5 MG CAPS Take 5 mg by mouth at bedtime.    . Menaquinone-7 (VITAMIN K2 PO) Take 150 mcg by mouth daily.    . pantoprazole (PROTONIX) 20 MG tablet Take 1 tablet (20 mg total) by mouth daily. (Patient not taking: Reported on 07/22/2017) 30 tablet 0  . Phosphatidylserine 100 MG CAPS Take 200 mg by mouth 2 (two) times daily before lunch and supper.    . testosterone cypionate  (DEPOTESTOSTERONE CYPIONATE) 200 MG/ML injection Inject 1 mL into the muscle every 7 (seven) days.  3  . testosterone cypionate (DEPOTESTOTERONE CYPIONATE) 100 MG/ML injection Inject 1 mL into the muscle once a week. For IM use only     No current facility-administered medications for this visit.    Allergies  Allergen Reactions  . Penicillins Rash    Was allergic to it as a child       Objective:  BP (!) 146/95   Pulse 77   Wt 253 lb (114.8 kg)   BMI 31.62 kg/m  Gen:  alert, not ill-appearing, no distress, appropriate for age 66: head normocephalic without obvious abnormality, conjunctiva and cornea clear, wearing glasses, trachea midline Pulm: Normal work of breathing, normal phonation Neuro: alert and oriented x 3, no tremor MSK: extremities atraumatic, normal gait and station Skin: intact, no rashes on exposed skin, no jaundice, no cyanosis Psych: well-groomed, cooperative, good eye contact, euthymic mood, affect mood-congruent, speech is articulate, and thought processes clear and goal-directed    No results found for this or any previous visit (from the past 72 hour(s)). No results found.    Assessment and Plan: 47 y.o. male with   Generalized anxiety disorder with panic attacks - Plan: ALPRAZolam (XANAX) 0.5 MG tablet  Hypertension goal BP (blood pressure) < 130/80  Noncompliance with medication regimen  Encounter for genetic screening  SSRI (selective serotonin reuptake inhibitor) causing adverse effect in therapeutic use  1. Generalized anxiety disorder with panic attacks - currently controlled on Xanax 0.5 mg tid prn. Do not plan to exceed 1.5 mg per day. I am temporarily increasing his quantity to 60 per month. Patient understands risks associated with benzodiazepine use and plans to taper off over the next 2-3 months - psychotropic gene testing pending. This will give [place] more insight into medication options   2. Hypertension goal BP (blood pressure)  < 130/80 BP Readings from Last 3 Encounters:  07/22/17 (!) 146/95  07/17/17 (!) 158/106  07/08/17 (!) 149/90  - declines antihypertensive medication.  - he is planning to manage with diet and exercise alone Risks and benefits have been discussed  3. Noncompliance with medication regimen - has been prescribed Amlodipine and Propranolol for his hypertension/palpitations. Previous provider prescribed Lisinopril and Toprol XL  4. Encounter for genetic screening - genesight psychotropic medication testing ordered today  5. SSRI (selective serotonin reuptake inhibitor) causing adverse effect  in therapeutic use - avoiding Paxil   Patient education and anticipatory guidance given Patient agrees with treatment plan Follow-up in 4 weeks or sooner as needed if symptoms worsen or fail to improve  I spent 25 minutes with this patient, greater than 50% was face-to-face time counseling regarding the above diagnoses   Darlyne Russian PA-C

## 2017-07-23 DIAGNOSIS — F4322 Adjustment disorder with anxiety: Secondary | ICD-10-CM | POA: Diagnosis not present

## 2017-07-24 ENCOUNTER — Telehealth: Payer: Self-pay | Admitting: Physician Assistant

## 2017-07-24 ENCOUNTER — Encounter: Payer: Self-pay | Admitting: Physician Assistant

## 2017-07-24 NOTE — Telephone Encounter (Signed)
Called and spoke to Eugene Coleman regarding his Genesight Psychotropic Pharmacogenomic Test. As expected, Paxil was listed as a significant gene-drug interaction Allergy list has been updated to reflect results of the test  Additionally, Eugene Coleman reports his anxiety symptoms are improving. He is continuing to sleep. Has reduced his Alprazolam to 0.25 mg three times daily.  Should Eugene Coleman decide to proceed with antidepressant therapy for his anxiety disorder, we discussed that SNRI's would be a good choice for him. He is interested in Duloxetine, but would like to defer medication for now.

## 2017-07-29 ENCOUNTER — Other Ambulatory Visit (HOSPITAL_COMMUNITY)
Admission: RE | Admit: 2017-07-29 | Discharge: 2017-07-29 | Disposition: A | Discharge status: Home / Self Care | Payer: BLUE CROSS/BLUE SHIELD | Source: Ambulatory Visit | Attending: Physician Assistant | Admitting: Physician Assistant

## 2017-07-29 ENCOUNTER — Ambulatory Visit
Admit: 2017-07-29 | Discharge: 2017-07-29 | Disposition: A | Discharge status: Home / Self Care | Payer: BLUE CROSS/BLUE SHIELD | Attending: Physician Assistant | Admitting: Physician Assistant

## 2017-07-29 ENCOUNTER — Encounter: Payer: Self-pay | Admitting: Physician Assistant

## 2017-07-29 DIAGNOSIS — E041 Nontoxic single thyroid nodule: Secondary | ICD-10-CM | POA: Insufficient documentation

## 2017-07-29 NOTE — Procedures (Signed)
PROCEDURE SUMMARY:  Using direct ultrasound guidance, 4 passes were made using 25 g needles into the nodule within the left lobe of the thyroid.   Ultrasound was used to confirm needle placements on all occasions.   Specimens were sent to Pathology for analysis.  See procedure note under Imaging tab in Epic for full procedure details.  Alyvia Derk S Kellis Topete PA-C 07/29/2017 4:20 PM

## 2017-07-30 ENCOUNTER — Ambulatory Visit: Payer: BLUE CROSS/BLUE SHIELD | Admitting: Physician Assistant

## 2017-07-30 ENCOUNTER — Encounter: Payer: Self-pay | Admitting: Physician Assistant

## 2017-07-30 NOTE — Progress Notes (Signed)
Hi [name],  Good news, your thyroid biopsy was benign. You should have a repeat surveillance ultrasound in 1 year and if stable, this can be followed every 2-5 years with ultrasound.  Eugene Coleman

## 2017-07-31 ENCOUNTER — Telehealth: Payer: Self-pay | Admitting: Physician Assistant

## 2017-07-31 DIAGNOSIS — R0683 Snoring: Secondary | ICD-10-CM

## 2017-07-31 NOTE — Telephone Encounter (Signed)
Please let patient know there was no evidence of sleep apnea on his sleep study There was no significant oxygen desaturation Sleep study showed typical palatal snoring pattern

## 2017-07-31 NOTE — Telephone Encounter (Signed)
Vm left for pt to call office and obtain results. Eugene Coleman, CCMA

## 2017-08-07 NOTE — Telephone Encounter (Signed)
LM on Vm for pt to return call for results. KG LPN

## 2017-08-19 ENCOUNTER — Ambulatory Visit: Payer: BLUE CROSS/BLUE SHIELD | Admitting: Physician Assistant

## 2017-08-26 ENCOUNTER — Encounter: Payer: Self-pay | Admitting: Physician Assistant

## 2017-08-26 DIAGNOSIS — Z9289 Personal history of other medical treatment: Secondary | ICD-10-CM | POA: Insufficient documentation

## 2017-09-08 ENCOUNTER — Ambulatory Visit: Payer: BLUE CROSS/BLUE SHIELD | Admitting: Physician Assistant

## 2017-09-09 DIAGNOSIS — K4090 Unilateral inguinal hernia, without obstruction or gangrene, not specified as recurrent: Secondary | ICD-10-CM | POA: Diagnosis not present

## 2017-09-17 DIAGNOSIS — K4090 Unilateral inguinal hernia, without obstruction or gangrene, not specified as recurrent: Secondary | ICD-10-CM | POA: Diagnosis not present

## 2017-11-17 DIAGNOSIS — M5136 Other intervertebral disc degeneration, lumbar region: Secondary | ICD-10-CM | POA: Diagnosis not present

## 2017-11-17 DIAGNOSIS — M9903 Segmental and somatic dysfunction of lumbar region: Secondary | ICD-10-CM | POA: Diagnosis not present

## 2017-11-18 DIAGNOSIS — M9903 Segmental and somatic dysfunction of lumbar region: Secondary | ICD-10-CM | POA: Diagnosis not present

## 2017-11-18 DIAGNOSIS — M5136 Other intervertebral disc degeneration, lumbar region: Secondary | ICD-10-CM | POA: Diagnosis not present

## 2017-11-20 DIAGNOSIS — M9903 Segmental and somatic dysfunction of lumbar region: Secondary | ICD-10-CM | POA: Diagnosis not present

## 2017-11-20 DIAGNOSIS — M5136 Other intervertebral disc degeneration, lumbar region: Secondary | ICD-10-CM | POA: Diagnosis not present

## 2018-08-28 DIAGNOSIS — K4090 Unilateral inguinal hernia, without obstruction or gangrene, not specified as recurrent: Secondary | ICD-10-CM | POA: Diagnosis not present

## 2018-09-02 ENCOUNTER — Encounter: Payer: Self-pay | Admitting: Physician Assistant

## 2018-09-02 ENCOUNTER — Ambulatory Visit (INDEPENDENT_AMBULATORY_CARE_PROVIDER_SITE_OTHER): Payer: BC Managed Care – PPO | Admitting: Physician Assistant

## 2018-09-02 VITALS — BP 141/89 | HR 80 | Wt 260.0 lb

## 2018-09-02 DIAGNOSIS — Z13220 Encounter for screening for lipoid disorders: Secondary | ICD-10-CM

## 2018-09-02 DIAGNOSIS — F411 Generalized anxiety disorder: Secondary | ICD-10-CM

## 2018-09-02 DIAGNOSIS — E042 Nontoxic multinodular goiter: Secondary | ICD-10-CM

## 2018-09-02 DIAGNOSIS — Z1389 Encounter for screening for other disorder: Secondary | ICD-10-CM

## 2018-09-02 DIAGNOSIS — Z130 Encounter for screening for diseases of the blood and blood-forming organs and certain disorders involving the immune mechanism: Secondary | ICD-10-CM

## 2018-09-02 DIAGNOSIS — Z131 Encounter for screening for diabetes mellitus: Secondary | ICD-10-CM

## 2018-09-02 DIAGNOSIS — I10 Essential (primary) hypertension: Secondary | ICD-10-CM | POA: Diagnosis not present

## 2018-09-02 DIAGNOSIS — E6609 Other obesity due to excess calories: Secondary | ICD-10-CM | POA: Insufficient documentation

## 2018-09-02 DIAGNOSIS — F410 Panic disorder [episodic paroxysmal anxiety] without agoraphobia: Secondary | ICD-10-CM

## 2018-09-02 DIAGNOSIS — K4090 Unilateral inguinal hernia, without obstruction or gangrene, not specified as recurrent: Secondary | ICD-10-CM | POA: Diagnosis not present

## 2018-09-02 MED ORDER — AMLODIPINE BESYLATE 5 MG PO TABS: 5.0000 mg | ORAL_TABLET | Freq: Every day | ORAL | 0 refills | Status: DC

## 2018-09-02 NOTE — Progress Notes (Signed)
Virtual Visit via Video Note  I connected with Eugene Coleman on 09/02/18 at  9:50 AM EDT by a video enabled telemedicine application and verified that I am speaking with the correct person using two identifiers.   I discussed the limitations of evaluation and management by telemedicine and the availability of in person appointments. The patient expressed understanding and agreed to proceed.  History of Present Illness: HPI:                                                                Eugene Coleman is a 48 y.o. male   CC:   HTN: taking Amlodipine 5 mg daily. Compliant with medications. Checks BP's at home. BP typically 135/85.  Denies vision change, headache, chest pain with exertion, orthopnea, lightheadedness, syncope and edema.  He has gained approx 17 lb. He is eating a low glycemic diet due to wife having gestational diabetes, but admits he has not had time for exercise. He had a negative sleep study on 07/31/17 Risk factors include: male sex, obesity, family hx  GAD: reports he has been symptom-free for the last 6 months. He experienced an intense period of rebound anxiety with panic attacks several months after tapering off of Paxil. He required Alprazolam 0.5 mg tid for a couple of months, but has not taken this medication in a year.  Thyroid nodules: underwent FNA 07/2017. Biopsy were benign. Due for surveillance ultrasound this year, then Q2-5years. Denies compressive symptoms.  Hernia: reports he is having right inguinal hernia repair on July 2nd with Dr. Tally Due.   Depression screen [facility] 2/9 09/02/2018 07/22/2017 07/08/2017 07/01/2017 04/25/2017  Decreased Interest 0 0 0 3 0  Down, Depressed, Hopeless 0 0 1 1 0  PHQ - 2 Score 0 0 1 4 0  Altered sleeping - 0 1 3 -  Tired, decreased energy - 1 2 3  0  Change in appetite - 1 0 0 0  Feeling bad or failure about yourself  - 0 0 1 0  Trouble concentrating - 0 0 2 0  Moving slowly or fidgety/restless - 0 0 0 0  Suicidal  thoughts - 0 0 0 0  PHQ-9 Score - 2 4 13  -    GAD 7 : Generalized Anxiety Score 07/22/2017 07/08/2017 07/01/2017  Nervous, Anxious, on Edge 1 1 3   Control/stop worrying 0 1 3  Worry too much - different things 0 1 3  Trouble relaxing 1 2 3   Restless 0 0 3  Easily annoyed or irritable 1 0 3  Afraid - awful might happen 0 1 3  Total GAD 7 Score 3 6 21       Past Medical History:  Diagnosis Date  . Allergy   . Depression with anxiety 07/03/2017  . Generalized anxiety disorder 11/06/2012  . Hyperlipidemia   . Hypertension   . 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 Multiple thyroid nodules 07/10/2017  . 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 Right thyroid nodule 07/10/2017  . 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 KNEE SURGERY Bilateral [phone]   left x 2, right x 1  . TONSILLECTOMY    .  wisdow teeth     childhood   Social History   Tobacco Use  . Smoking status: Never Smoker  . Smokeless tobacco: Former Network engineer Use Topics  . Alcohol use: No   family history includes Breast cancer in his mother; Diverticulitis in his mother; Heart attack in his maternal grandfather and paternal grandfather; Hypertension in his father; Leukemia in his father; Lupus in his sister; Stroke in his paternal grandfather; Thyroid disease in his cousin.    ROS: negative except as noted in the HPI  Medications: Current Outpatient Medications  Medication Sig Dispense Refill  . amLODipine (NORVASC) 5 MG tablet Take 1 tablet (5 mg total) by mouth daily. 90 tablet 0  . cholecalciferol (VITAMIN D3) 25 MCG (1000 UT) tablet Take 1,000 Units by mouth daily.    . Omega-3 Fatty Acids (FISH OIL) 1000 MG CAPS Take by mouth.     No current facility-administered medications for this visit.    Allergies  Allergen Reactions  . Lamictal [Lamotrigine] Other  (See Comments)    Significant gene-drug interaction (genetic testing, has never taken this medication)  . Paxil [Paroxetine Hcl] Other (See Comments)    Significant gene-drug interaction  . Zyprexa [Olanzapine] Other (See Comments)    Significant gene-drug interaction (genetic testing, has never taken this medication)  . Penicillins Rash    Was allergic to it as a child       Objective:  BP (!) 141/89   Pulse 80   Wt 260 lb (117.9 kg)   BMI 32.50 kg/m  Gen:  alert, not ill-appearing, no distress, appropriate for age 32: head normocephalic without obvious abnormality, conjunctiva and cornea clear, trachea midline Pulm: Normal work of breathing, normal phonation Neuro: alert and oriented x 3 Psych: cooperative, euthymic mood, affect mood-congruent, speech is articulate, normal rate and volume; thought processes clear and goal-directed, normal judgment, good insight   BP Readings from Last 3 Encounters:  09/02/18 (!) 141/89  07/22/17 (!) 146/95  07/17/17 (!) 158/106    Wt Readings from Last 3 Encounters:  09/02/18 260 lb (117.9 kg)  07/22/17 253 lb (114.8 kg)  07/08/17 243 lb (110.2 kg)     No results found for this or any previous visit (from the past 75 hour(s)). No results found.    Assessment and Plan: 48 y.o. male with   .[name] was seen today for medication management.  Diagnoses and all orders for this visit:  Hypertension goal BP (blood pressure) < 130/80 -     amLODipine (NORVASC) 5 MG tablet; Take 1 tablet (5 mg total) by mouth daily. -     COMPLETE METABOLIC PANEL WITH GFR -     Lipid Panel w/reflex Direct LDL -     CBC -     TSH + free T4 -     Urinalysis, Routine w reflex microscopic  Multiple thyroid nodules -     TSH + free T4 -     US THYROID; Future  Right inguinal hernia  Generalized anxiety disorder with panic attacks  Class 1 obesity due to excess calories with serious comorbidity in adult, unspecified BMI -     COMPLETE  METABOLIC PANEL WITH GFR -     Lipid Panel w/reflex Direct LDL -     CBC -     TSH + free T4  Screening for lipid disorders -     Lipid Panel w/reflex Direct LDL  Screening for diabetes mellitus -     COMPLETE  METABOLIC PANEL WITH GFR  Screening for blood disease -     CBC  Screening for blood or protein in urine -     Urinalysis, Routine w reflex microscopic   HTN: BP in early stage 2 hypertensive range at home today. Typically home readings in the stage 1 range. Patient is working on weight loss and does not wish to increase medication. Cont Amlodipine 5 mg. Counseled to self-titrate to 10 mg if BP is consistently above 140/90 or if it remains 130-139/80-89 for more than 3 months despite regular exercise and weight loss. Counseled on therapeutic lifestyle changes. Routine fasting labs pending  GAD: well controlled without medication  Multiple thyroid nodules: no compressive sx. 1-year surveillance [place] pending.  Weight gain/Obesity class 1: TSH, CMP, lipids pending.   Follow-up in 6 months or sooner as needed   Follow Up Instructions:    I discussed the assessment and treatment plan with the patient. The patient was provided an opportunity to ask questions and all were answered. The patient agreed with the plan and demonstrated an understanding of the instructions.   The patient was advised to call back or seek an in-person evaluation if the symptoms worsen or if the condition fails to improve as anticipated.  I provided 10 minutes of non-face-to-face time during this encounter.   Trixie Dredge, [place]

## 2018-09-04 ENCOUNTER — Other Ambulatory Visit: Payer: BC Managed Care – PPO

## 2018-09-07 ENCOUNTER — Other Ambulatory Visit: Payer: BC Managed Care – PPO

## 2018-09-07 DIAGNOSIS — Z01812 Encounter for preprocedural laboratory examination: Secondary | ICD-10-CM | POA: Diagnosis not present

## 2018-09-07 DIAGNOSIS — Z1159 Encounter for screening for other viral diseases: Secondary | ICD-10-CM | POA: Diagnosis not present

## 2018-09-07 DIAGNOSIS — K4090 Unilateral inguinal hernia, without obstruction or gangrene, not specified as recurrent: Secondary | ICD-10-CM | POA: Diagnosis not present

## 2018-09-10 DIAGNOSIS — Z96653 Presence of artificial knee joint, bilateral: Secondary | ICD-10-CM | POA: Diagnosis not present

## 2018-09-10 DIAGNOSIS — K4020 Bilateral inguinal hernia, without obstruction or gangrene, not specified as recurrent: Secondary | ICD-10-CM | POA: Diagnosis not present

## 2018-09-10 DIAGNOSIS — Z79899 Other long term (current) drug therapy: Secondary | ICD-10-CM | POA: Diagnosis not present

## 2018-09-10 DIAGNOSIS — E669 Obesity, unspecified: Secondary | ICD-10-CM | POA: Diagnosis not present

## 2018-09-10 DIAGNOSIS — Z880 Allergy status to penicillin: Secondary | ICD-10-CM | POA: Diagnosis not present

## 2018-09-10 DIAGNOSIS — D176 Benign lipomatous neoplasm of spermatic cord: Secondary | ICD-10-CM | POA: Diagnosis not present

## 2018-09-10 DIAGNOSIS — Z302 Encounter for sterilization: Secondary | ICD-10-CM | POA: Diagnosis not present

## 2018-09-10 DIAGNOSIS — I10 Essential (primary) hypertension: Secondary | ICD-10-CM | POA: Diagnosis not present

## 2018-09-10 DIAGNOSIS — Z87891 Personal history of nicotine dependence: Secondary | ICD-10-CM | POA: Diagnosis not present

## 2018-09-10 DIAGNOSIS — K4090 Unilateral inguinal hernia, without obstruction or gangrene, not specified as recurrent: Secondary | ICD-10-CM | POA: Diagnosis not present

## 2018-09-15 ENCOUNTER — Ambulatory Visit (INDEPENDENT_AMBULATORY_CARE_PROVIDER_SITE_OTHER): Payer: BC Managed Care – PPO

## 2018-09-15 ENCOUNTER — Other Ambulatory Visit: Payer: Self-pay

## 2018-09-15 DIAGNOSIS — Z13220 Encounter for screening for lipoid disorders: Secondary | ICD-10-CM | POA: Diagnosis not present

## 2018-09-15 DIAGNOSIS — I10 Essential (primary) hypertension: Secondary | ICD-10-CM | POA: Diagnosis not present

## 2018-09-15 DIAGNOSIS — E042 Nontoxic multinodular goiter: Secondary | ICD-10-CM

## 2018-09-15 DIAGNOSIS — Z131 Encounter for screening for diabetes mellitus: Secondary | ICD-10-CM | POA: Diagnosis not present

## 2018-09-16 LAB — CBC
HCT: 46.1 % (ref 38.5–50.0)
Hemoglobin: 15.5 g/dL (ref 13.2–17.1)
MCH: 30.5 pg (ref 27.0–33.0)
MCHC: 33.6 g/dL (ref 32.0–36.0)
MCV: 90.6 fL (ref 80.0–100.0)
MPV: 12 fL (ref 7.5–12.5)
Platelets: 253 10*3/uL (ref 140–400)
RBC: 5.09 10*6/uL (ref 4.20–5.80)
RDW: 12.4 % (ref 11.0–15.0)
WBC: 7.3 10*3/uL (ref 3.8–10.8)

## 2018-09-16 LAB — COMPLETE METABOLIC PANEL WITH GFR
AG Ratio: 1.8 (calc) (ref 1.0–2.5)
ALT: 36 U/L (ref 9–46)
AST: 19 U/L (ref 10–40)
Albumin: 4.1 g/dL (ref 3.6–5.1)
Alkaline phosphatase (APISO): 61 U/L (ref 36–130)
BUN: 22 mg/dL (ref 7–25)
CO2: 28 mmol/L (ref 20–32)
Calcium: 10.1 mg/dL (ref 8.6–10.3)
Chloride: 107 mmol/L (ref 98–110)
Creat: 0.86 mg/dL (ref 0.60–1.35)
GFR, Est African American: 118 mL/min/{1.73_m2} (ref >=60)
GFR, Est Non African American: 102 mL/min/{1.73_m2} (ref >=60)
Globulin: 2.3 g/dL (calc) (ref 1.9–3.7)
Glucose, Bld: 99 mg/dL (ref 65–139)
Potassium: 3.9 mmol/L (ref 3.5–5.3)
Sodium: 142 mmol/L (ref 135–146)
Total Bilirubin: 0.5 mg/dL (ref 0.2–1.2)
Total Protein: 6.4 g/dL (ref 6.1–8.1)

## 2018-09-16 LAB — LIPID PANEL W/REFLEX DIRECT LDL
Cholesterol: 180 mg/dL (ref <200)
HDL: 45 mg/dL (ref >=40)
LDL Cholesterol (Calc): 109 mg/dL (calc) — ABNORMAL HIGH
Non-HDL Cholesterol (Calc): 135 mg/dL (calc) — ABNORMAL HIGH (ref <130)
Total CHOL/HDL Ratio: 4 (calc) (ref <5.0)
Triglycerides: 150 mg/dL — ABNORMAL HIGH (ref <150)

## 2018-09-16 LAB — URINALYSIS, ROUTINE W REFLEX MICROSCOPIC
Bilirubin Urine: NEGATIVE
Glucose, UA: NEGATIVE
Hgb urine dipstick: NEGATIVE
Ketones, ur: NEGATIVE
Leukocytes,Ua: NEGATIVE
Nitrite: NEGATIVE
Protein, ur: NEGATIVE
Specific Gravity, Urine: 1.019 (ref 1.001–1.03)
pH: <=5 (ref 5.0–8.0)

## 2018-09-16 LAB — TSH+FREE T4: TSH W/REFLEX TO FT4: 0.52 mIU/L (ref 0.40–4.50)

## 2018-11-05 ENCOUNTER — Encounter: Payer: Self-pay | Admitting: Physician Assistant

## 2018-11-06 ENCOUNTER — Encounter: Payer: Self-pay | Admitting: Gastroenterology

## 2018-12-01 ENCOUNTER — Other Ambulatory Visit: Payer: Self-pay | Admitting: Physician Assistant

## 2018-12-01 DIAGNOSIS — I10 Essential (primary) hypertension: Secondary | ICD-10-CM

## 2018-12-03 ENCOUNTER — Other Ambulatory Visit: Payer: Self-pay

## 2018-12-03 ENCOUNTER — Emergency Department (HOSPITAL_COMMUNITY)
Admission: EM | Admit: 2018-12-03 | Discharge: 2018-12-03 | Disposition: A | Discharge status: Home / Self Care | Payer: BC Managed Care – PPO | Attending: Emergency Medicine | Admitting: Emergency Medicine

## 2018-12-03 ENCOUNTER — Encounter (HOSPITAL_COMMUNITY): Payer: Self-pay | Admitting: Emergency Medicine

## 2018-12-03 ENCOUNTER — Emergency Department (HOSPITAL_COMMUNITY): Payer: BC Managed Care – PPO

## 2018-12-03 DIAGNOSIS — I10 Essential (primary) hypertension: Secondary | ICD-10-CM | POA: Diagnosis not present

## 2018-12-03 DIAGNOSIS — Y999 Unspecified external cause status: Secondary | ICD-10-CM | POA: Insufficient documentation

## 2018-12-03 DIAGNOSIS — S9032XA Contusion of left foot, initial encounter: Secondary | ICD-10-CM | POA: Insufficient documentation

## 2018-12-03 DIAGNOSIS — W208XXA Other cause of strike by thrown, projected or falling object, initial encounter: Secondary | ICD-10-CM | POA: Diagnosis not present

## 2018-12-03 DIAGNOSIS — Z79899 Other long term (current) drug therapy: Secondary | ICD-10-CM | POA: Insufficient documentation

## 2018-12-03 DIAGNOSIS — M79672 Pain in left foot: Secondary | ICD-10-CM | POA: Diagnosis not present

## 2018-12-03 DIAGNOSIS — M7989 Other specified soft tissue disorders: Secondary | ICD-10-CM | POA: Diagnosis not present

## 2018-12-03 DIAGNOSIS — Y929 Unspecified place or not applicable: Secondary | ICD-10-CM | POA: Diagnosis not present

## 2018-12-03 DIAGNOSIS — Y939 Activity, unspecified: Secondary | ICD-10-CM | POA: Insufficient documentation

## 2018-12-03 DIAGNOSIS — S99922A Unspecified injury of left foot, initial encounter: Secondary | ICD-10-CM | POA: Diagnosis not present

## 2018-12-03 MED ORDER — NAPROXEN 500 MG PO TABS: 500.0000 mg | ORAL_TABLET | Freq: Two times a day (BID) | ORAL | 0 refills | Status: DC

## 2018-12-03 NOTE — Discharge Instructions (Signed)
The injury to your foot has not broken any bones thankfully.  Please apply ice as needed intermittently, naproxen twice daily for pain  See Dr. Aline Brochure in the office as needed for ongoing pain or swelling, there may be a tendon injury but at this time it appears to be intact

## 2018-12-03 NOTE — ED Triage Notes (Signed)
"  I broke my foot" 2 weeks ago, states dropped a big beam on left foot. States till hurting.bruising gone. States second toe still hurts rating 4. Has no sought any tx for it at all. Pedal pulses and cap refill wnl.

## 2018-12-03 NOTE — ED Provider Notes (Signed)
[facility] EMERGENCY DEPARTMENT Provider Note   CSN: [id number] Arrival date & time: 12/03/18  [id number]     History   Chief Complaint Chief Complaint  Patient presents with  . Foot Pain    HPI Eugene Coleman is a 48 y.o. male.     HPI  This patient is a 48 year old male presenting to the hospital after having a contusion to the top of his left foot where he dropped a 350 pound beam on top of the foot 2 weeks ago.  He has been walking on it, he states that it initially bruised up significantly but the bruising has now faded and gone away.  The problem is that over the last couple of days he is noticed some increasing pain especially with ambulating.  The pain seems to radiate into the bottom of the foot as well.  There is no bleeding, he is not on a blood thinner.  He has not yet been evaluated for this injury.  Past Medical History:  Diagnosis Date  . Allergy   . Depression with anxiety 07/03/2017  . Generalized anxiety disorder 11/06/2012  . Hyperlipidemia   . Hypertension   . 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 Multiple thyroid nodules 07/10/2017  . 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 Right thyroid nodule 07/10/2017  . 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 Right inguinal hernia 09/02/2018  . Class 1 obesity due to excess calories with serious comorbidity in adult 09/02/2018  . History of normal Holter exam 08/26/2017  . Primary snoring 07/31/2017  . SSRI (selective serotonin reuptake inhibitor) causing adverse effect in therapeutic use 07/22/2017  . Multiple thyroid nodules 07/10/2017  . Asymmetric blood pressures 07/08/2017  . Heart palpitations 07/08/2017  . Cervical paraspinal muscle spasm 07/03/2017  . Excessive daytime sleepiness 07/03/2017  . At risk for sleep apnea 07/03/2017  . Hypertension goal BP (blood pressure)  < 130/80 06/18/2017  . 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 Family history of autoimmune disorder 04/25/2017  . Family history of inflammatory bowel disease 04/25/2017  . 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 GERD (gastroesophageal reflux disease) 12/09/2012  . Generalized anxiety disorder with panic attacks 11/06/2012  . Right sided abdominal pain 01/08/2012    Past Surgical History:  Procedure Laterality Date  . KNEE SURGERY Bilateral [phone]   left x 2, right x 1  . TONSILLECTOMY    . wisdow teeth     childhood        Home Medications    Prior to Admission medications   Medication Sig Start Date End Date Taking? Authorizing Provider  amLODipine (NORVASC) 5 MG tablet Take 1 tablet (5 mg total) by mouth daily. Due for follow up visit 12/01/18   Silverio Decamp, MD  cholecalciferol (VITAMIN D3) 25 MCG (1000 UT) tablet Take 1,000 Units by mouth daily.    [provider]  HYDROcodone-acetaminophen (NORCO/VICODIN) 5-325 MG tablet  09/10/18   [provider]  methocarbamol (ROBAXIN) 500 MG tablet  09/10/18   [provider]  naproxen (NAPROSYN) 500 MG tablet Take 1 tablet (500 mg total) by mouth 2 (two) times daily with a meal. 12/03/18   Noemi Chapel, MD  Omega-3 Fatty Acids (FISH OIL) 1000 MG CAPS Take by mouth.    [provider]  Family History Family History  Problem Relation Age of Onset  . Breast cancer Mother   . Diverticulitis Mother   . Leukemia Father   . Hypertension Father   . Lupus Sister   . Thyroid disease Cousin   . Heart attack Maternal Grandfather   . Heart attack Paternal Grandfather   . Stroke Paternal Grandfather   . Colon cancer Neg Hx   . Colon polyps Neg Hx   . Esophageal cancer Neg Hx   . Rectal cancer Neg Hx     Social History Social History   Tobacco Use  . Smoking status: Never Smoker  . Smokeless tobacco:  Former Network engineer Use Topics  . Alcohol use: No  . Drug use: No     Allergies   Lamictal [lamotrigine], Paxil [paroxetine hcl], Zyprexa [olanzapine], and Penicillins   Review of Systems Review of Systems  Musculoskeletal: Positive for gait problem and joint swelling.  Skin: Negative for color change and wound.  Neurological: Negative for numbness.     Physical Exam Updated Vital Signs BP (!) 142/102 (BP Location: Left Arm)   Pulse (!) 52   Temp 98.2 F (36.8 C) (Oral)   Resp 17   SpO2 98%   Physical Exam Vitals signs and nursing note reviewed.  Constitutional:      Appearance: He is well-developed. He is not diaphoretic.  HENT:     Head: Normocephalic and atraumatic.  Eyes:     General:        Right eye: No discharge.        Left eye: No discharge.     Conjunctiva/sclera: Conjunctivae normal.  Pulmonary:     Effort: Pulmonary effort is normal. No respiratory distress.  Musculoskeletal:     Comments: Left foot with tenderness over the distal second metatarsal.  There is mild swelling, no bruising, no open wounds, able to move all 5 toes to both flexion and extension though he has some slight weakness to extension of the second toe.  Skin:    General: Skin is warm and dry.     Findings: No erythema or rash.  Neurological:     Mental Status: He is alert.     Coordination: Coordination normal.     Comments: No numbness or weakness of the foot except for the second toe in extension as noted above      ED Treatments / Results  Labs (all labs ordered are listed, but only abnormal results are displayed) Labs Reviewed - No data to display  EKG None  Radiology Dg Foot Complete Left  Result Date: 12/03/2018 CLINICAL DATA:  Pain post blunt trauma 2 weeks ago EXAM: LEFT FOOT - COMPLETE 3+ VIEW COMPARISON:  None. FINDINGS: There is no evidence of fracture or dislocation. There is no evidence of arthropathy or other focal bone abnormality. No radiodense foreign  body. Mild soft tissue swelling dorsal to metatarsals. IMPRESSION: Negative for fracture or other acute bone abnormality. Electronically Signed   By: Lucrezia Europe M.D.   On: 12/03/2018 10:00    Procedures Procedures (including critical care time)  Medications Ordered in ED Medications - No data to display   Initial Impression / Assessment and Plan / ED Course  I have reviewed the triage vital signs and the nursing notes.  Pertinent labs & imaging results that were available during my care of the patient were reviewed by me and considered in my medical decision making (see chart for details).  Clinical Course as of  Dec 04 749  Thu Dec 03, 2018  1023 I have personally looked at the x-ray of the foot, the patient's left foot has no signs of fractures or dislocations.  The patient will be updated, recommended cryotherapy anti-inflammatories and ongoing evaluation.  May be given a cam walker for assistance   [BM]    Clinical Course User Index [BM] Noemi Chapel, MD      Rule out fracture, possibly tendon rupture though there is some ability to extend the second toe.  Patient declines pain medication at this time, x-ray pending  xrqy neg, pt has stiff shoe - declines brace / splint  Stable for d/c.  Final Clinical Impressions(s) / ED Diagnoses   Final diagnoses:  Contusion of left foot, initial encounter    ED Discharge Orders         Ordered    naproxen (NAPROSYN) 500 MG tablet  2 times daily with meals     12/03/18 1024           Noemi Chapel, MD 12/05/18 [phone]

## 2018-12-10 ENCOUNTER — Other Ambulatory Visit: Payer: Self-pay

## 2018-12-10 ENCOUNTER — Telehealth: Payer: Self-pay | Admitting: *Deleted

## 2018-12-10 ENCOUNTER — Ambulatory Visit (AMBULATORY_SURGERY_CENTER): Payer: Self-pay | Admitting: *Deleted

## 2018-12-10 VITALS — Temp 97.3°F | Ht 75.0 in | Wt 275.0 lb

## 2018-12-10 DIAGNOSIS — T884XXA Failed or difficult intubation, initial encounter: Secondary | ICD-10-CM | POA: Insufficient documentation

## 2018-12-10 DIAGNOSIS — Z1211 Encounter for screening for malignant neoplasm of colon: Secondary | ICD-10-CM

## 2018-12-10 MED ORDER — SUPREP BOWEL PREP KIT 17.5-3.13-1.6 GM/177ML PO SOLN: 1.0000 | Freq: Once | ORAL | 0 refills | Status: AC

## 2018-12-10 NOTE — Telephone Encounter (Signed)
Direct at [facility] please

## 2018-12-10 NOTE — Progress Notes (Signed)
No egg or soy allergy known to patient  No issues with past sedation with any surgeries  or procedures, past  intubation  Difficulty  No diet pills per patient No home 02 use per patient  No blood thinners per patient  Pt denies issues with constipation  No A fib or A flutter  EMMI video sent to pt's e mail  Suprep $15 coupon to pt  During PV pt informed me he was a difficult intubation with a knee surgery age 48- this is also documented in the 2020 surgery note with anesthesia- glide scope used with 2020 ing bilat hernia surgery - completed PV- note  to MS  ? OV vs Direct [facility]- pt given blank instructions and discussed with pt in PV today   Due to the COVID-19 pandemic we are asking patients to follow these guidelines. Please only bring one care partner. Please be aware that your care partner may wait in the car in the parking lot or if they feel like they will be too hot to wait in the car, they may wait in the lobby on the 4th floor. All care partners are required to wear a mask the entire time (we do not have any that we can provide them), they need to practice social distancing, and we will do a Covid check for all patient's and care partners when you arrive. Also we will check their temperature and your temperature. If the care partner waits in their car they need to stay in the parking lot the entire time and we will call them on their cell phone when the patient is ready for discharge so they can bring the car to the front of the building. Also all patient's will need to wear a mask into building.

## 2018-12-10 NOTE — Telephone Encounter (Signed)
Thank you sir.  Eugene Coleman, Can you please schedule this for me . Pt has blank hospital  instructions for whne we call him with new date and times

## 2018-12-10 NOTE — Telephone Encounter (Signed)
Dr Fuller Plan,  During PV this morning  pt informed me he was a difficult intubation with a knee surgery age 48- this is also documented in the 2020 surgery note with anesthesia for a bilateral hernia repair with vasectomy - glide scope used with 2020 inguinal  bilat hernia surgery -This pt has no GI hx-   Do you want him to have an OV or can he be direct at [facility]?  Please advise- thanks so much, Lelan Pons

## 2018-12-15 ENCOUNTER — Other Ambulatory Visit: Payer: Self-pay

## 2018-12-15 DIAGNOSIS — Z1211 Encounter for screening for malignant neoplasm of colon: Secondary | ICD-10-CM

## 2018-12-15 NOTE — Telephone Encounter (Signed)
Left message for patient to call back Patient has been scheduled for colon [facility] 01/26/19 9:30.  Covid screen 01/22/19 3:15

## 2018-12-17 ENCOUNTER — Encounter: Payer: Self-pay | Admitting: Gastroenterology

## 2018-12-17 NOTE — Telephone Encounter (Signed)
Left message for patient to call back  

## 2018-12-18 NOTE — Telephone Encounter (Signed)
I have been unable to reach the patient by phone.  I sent him a MyChart message and left a VM asking him to check his MyChart and call if he has questions.

## 2018-12-23 ENCOUNTER — Other Ambulatory Visit: Payer: Self-pay | Admitting: Sports Medicine

## 2018-12-23 DIAGNOSIS — I10 Essential (primary) hypertension: Secondary | ICD-10-CM

## 2018-12-24 DIAGNOSIS — Z9852 Vasectomy status: Secondary | ICD-10-CM | POA: Diagnosis not present

## 2018-12-29 ENCOUNTER — Encounter: Payer: BC Managed Care – PPO | Admitting: Gastroenterology

## 2019-01-22 ENCOUNTER — Inpatient Hospital Stay (HOSPITAL_COMMUNITY): Admission: RE | Admit: 2019-01-22 | Payer: BC Managed Care – PPO | Source: Ambulatory Visit

## 2019-01-25 ENCOUNTER — Telehealth: Payer: Self-pay

## 2019-01-25 NOTE — Telephone Encounter (Signed)
I left a message for the patient that we need to cancel his colonoscopy scheduled for 01/26/19 at [facility].  Patient did not go for his COVID screen on Friday.  [facility] endo has also left a message for the patient that his appt has been scheduled.  He is asked to call back to reschedule.

## 2019-01-25 NOTE — Progress Notes (Signed)
Patient did not show for Covid 19 testing prior to procedure for on 01/26/2019.  Left message with Dr. Lynne Leader RN.  Left message on patient's voicemessage.

## 2019-01-26 ENCOUNTER — Ambulatory Visit (HOSPITAL_COMMUNITY)
Admission: RE | Admit: 2019-01-26 | Payer: BC Managed Care – PPO | Source: Home / Self Care | Admitting: Gastroenterology

## 2019-01-26 ENCOUNTER — Encounter (HOSPITAL_COMMUNITY): Admission: RE | Payer: Self-pay | Source: Home / Self Care

## 2019-01-26 SURGERY — COLONOSCOPY WITH PROPOFOL
Anesthesia: Monitor Anesthesia Care

## 2019-03-01 ENCOUNTER — Encounter: Payer: Self-pay | Admitting: Physician Assistant

## 2019-03-01 DIAGNOSIS — I10 Essential (primary) hypertension: Secondary | ICD-10-CM

## 2019-03-01 MED ORDER — AMLODIPINE BESYLATE 5 MG PO TABS: ORAL_TABLET | ORAL | 0 refills | Status: DC

## 2019-03-06 ENCOUNTER — Encounter: Payer: Self-pay | Admitting: Osteopathic Medicine

## 2019-03-08 ENCOUNTER — Other Ambulatory Visit: Payer: Self-pay | Admitting: Family Medicine

## 2019-03-08 DIAGNOSIS — I10 Essential (primary) hypertension: Secondary | ICD-10-CM

## 2019-03-10 ENCOUNTER — Ambulatory Visit: Payer: BC Managed Care – PPO | Admitting: Osteopathic Medicine

## 2019-03-11 ENCOUNTER — Ambulatory Visit (INDEPENDENT_AMBULATORY_CARE_PROVIDER_SITE_OTHER): Payer: BC Managed Care – PPO | Admitting: Physician Assistant

## 2019-03-11 ENCOUNTER — Other Ambulatory Visit: Payer: Self-pay

## 2019-03-11 ENCOUNTER — Encounter: Payer: Self-pay | Admitting: Physician Assistant

## 2019-03-11 VITALS — BP 144/90 | HR 74 | Temp 99.2°F | Resp 16 | Ht 75.0 in | Wt 289.0 lb

## 2019-03-11 DIAGNOSIS — I10 Essential (primary) hypertension: Secondary | ICD-10-CM | POA: Diagnosis not present

## 2019-03-11 DIAGNOSIS — Z1211 Encounter for screening for malignant neoplasm of colon: Secondary | ICD-10-CM | POA: Diagnosis not present

## 2019-03-11 MED ORDER — AMLODIPINE BESYLATE 5 MG PO TABS: 5.0000 mg | ORAL_TABLET | Freq: Every day | ORAL | 1 refills | Status: DC

## 2019-03-11 NOTE — Progress Notes (Signed)
Patient presents to clinic today to establish care.  Patient is transferring from [facility] health primary care at [place] Concerns: Denies acute concerns today.   Chronic Issues: Hypertension -- Patient is currently on a regimen of amlodipine 5 mg QD. Endorses taking as directed and tolerating well. Patient denies chest pain, palpitations, lightheadedness, dizziness, vision changes or frequent headaches.  Has history also of [place] Hypertension, noting BP is always borderline but BP outside of office are better.   Health Maintenance: Immunizations -- Declines flu shot.  Colonoscopy -- Due. Asymptomatic. Agrees to colonoscopy. Notes having one scheduled previously but had to be canceled.   Past Medical History:  Diagnosis Date  . Allergy   . Arthritis    RA  . Depression with anxiety 07/03/2017  . Difficult airway for intubation   . Generalized anxiety disorder 11/06/2012  . GERD (gastroesophageal reflux disease)   . Hyperlipidemia   . Hypertension   . 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 Multiple thyroid nodules 07/10/2017  . 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 Right thyroid nodule 07/10/2017  . 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 INGUINAL HERNIA REPAIR Bilateral    with Vasectomy   . KNEE SURGERY Bilateral [phone]   left x 2, right x 1  . TONSILLECTOMY    . VASECTOMY    . WISDOM TOOTH EXTRACTION     childhood    Current Outpatient Medications on File Prior to Visit  Medication Sig Dispense Refill  . amLODipine (NORVASC) 5 MG tablet TAKE 1 TABLET BY MOUTH EVERY DAY **NEED FOLLOW UP OFFICE VISIT** 15 tablet 0  . cholecalciferol (VITAMIN D3) 25 MCG (1000 UT) tablet Take 1,000 Units by mouth 2 (two) times a week.     . naproxen (NAPROSYN) 500 MG tablet Take 1 tablet (500 mg total) by mouth 2 (two)  times daily with a meal. (Patient taking differently: Take 500 mg by mouth daily as needed for headache. ) 30 tablet 0  . Omega-3 Fatty Acids (FISH OIL) 1000 MG CAPS Take 1,000 mg by mouth 2 (two) times a week.      No current facility-administered medications on file prior to visit.    Allergies  Allergen Reactions  . Lamictal [Lamotrigine] Other (See Comments)    Significant gene-drug interaction (genetic testing, has never taken this medication)  . Paxil [Paroxetine Hcl] Other (See Comments)    Significant gene-drug interaction  . Zyprexa [Olanzapine] Other (See Comments)    Significant gene-drug interaction (genetic testing, has never taken this medication)  . Penicillins Rash    Was allergic to it as a child  Did it involve swelling of the face/tongue/throat, SOB, or low BP? No Did it involve sudden or severe rash/hives, skin peeling, or any reaction on the inside of your mouth or nose? No Did you need to seek medical attention at a hospital or doctor's office? No When did it last happen?Childhood If all above answers are "NO", may proceed with cephalosporin use.    Family History  Problem Relation Age of Onset  . Breast cancer Mother   . Diverticulitis Mother   . Leukemia Father   . Hypertension Father   . Lupus Sister   . Thyroid disease Cousin   . Heart attack Maternal Grandfather   . Heart attack Paternal Grandfather   . Stroke Paternal Grandfather   .  Colon cancer Neg Hx   . Colon polyps Neg Hx   . Esophageal cancer Neg Hx   . Rectal cancer Neg Hx     Social History   Socioeconomic History  . Marital status: Married    Spouse name: Not on file  . Number of children: Not on file  . Years of education: Not on file  . Highest education level: Not on file  Occupational History  . Not on file  Tobacco Use  . Smoking status: Never Smoker  . Smokeless tobacco: Former Network engineer and Sexual Activity  . Alcohol use: No  . Drug use: No  . Sexual  activity: Yes  Other Topics Concern  . Not on file  Social History Narrative  . Not on file   Social Determinants of Health   Financial Resource Strain:   . Difficulty of Paying Living Expenses: Not on file  Food Insecurity:   . Worried About Charity fundraiser in the Last Year: Not on file  . Ran Out of Food in the Last Year: Not on file  Transportation Needs:   . Lack of Transportation (Medical): Not on file  . Lack of Transportation (Non-Medical): Not on file  Physical Activity:   . Days of Exercise per Week: Not on file  . Minutes of Exercise per Session: Not on file  Stress:   . Feeling of Stress : Not on file  Social Connections:   . Frequency of Communication with Friends and Family: Not on file  . Frequency of Social Gatherings with Friends and Family: Not on file  . Attends Religious Services: Not on file  . Active Member of Clubs or Organizations: Not on file  . Attends Archivist Meetings: Not on file  . Marital Status: Not on file  Intimate Partner Violence:   . Fear of Current or Ex-Partner: Not on file  . Emotionally Abused: Not on file  . Physically Abused: Not on file  . Sexually Abused: Not on file   Review of Systems  Constitutional: Negative for fever and weight loss.  HENT: Negative for ear discharge, ear pain, hearing loss and tinnitus.   Eyes: Negative for blurred vision, double vision, photophobia and pain.  Respiratory: Negative for cough and shortness of breath.   Cardiovascular: Negative for chest pain and palpitations.  Gastrointestinal: Negative for abdominal pain, blood in stool, constipation, diarrhea, heartburn, melena, nausea and vomiting.  Genitourinary: Negative for dysuria, flank pain, frequency, hematuria and urgency.  Musculoskeletal: Negative for falls.  Neurological: Negative for dizziness, loss of consciousness and headaches.  Endo/Heme/Allergies: Negative for environmental allergies.  Psychiatric/Behavioral: Negative for  depression, hallucinations, substance abuse and suicidal ideas. The patient is not nervous/anxious and does not have insomnia.     Resp 16   Ht 6\' 3"  (1.905 m)   Wt 289 lb (131.1 kg)   BMI 36.12 kg/m   Physical Exam Vitals reviewed.  Constitutional:      Appearance: Normal appearance.  HENT:     Head: Normocephalic and atraumatic.  Eyes:     Conjunctiva/sclera: Conjunctivae normal.     Pupils: Pupils are equal, round, and reactive to light.  Cardiovascular:     Rate and Rhythm: Normal rate and regular rhythm.     Pulses: Normal pulses.     Heart sounds: Normal heart sounds.  Pulmonary:     Effort: Pulmonary effort is normal.     Breath sounds: Normal breath sounds.  Musculoskeletal:  Cervical back: Neck supple.  Neurological:     General: No focal deficit present.     Mental Status: He is alert and oriented to person, place, and time.  Psychiatric:        Mood and Affect: Mood normal.    Assessment/Plan: 1. Hypertension goal BP (blood pressure) < 130/80 BP slightly above goal even on recheck. Patient notes being nervous. Asymptomatic. Will have him continue current regimen and start checking BP at home and recording. He is to call with these numbers so we can determine if medication change is warranted. - amLODipine (NORVASC) 5 MG tablet; Take 1 tablet (5 mg total) by mouth daily.  Dispense: 90 tablet; Refill: 1  2. Colon cancer screening Referral to GI placed for screening colonoscopy. - Ambulatory referral to Gastroenterology   Leeanne Rio, PA-C

## 2019-03-11 NOTE — Patient Instructions (Signed)
-  Please continue medications as directed. -Make sure to keep a low-salt diet (see below). -Keep up with the daily exercise. Over time we should see changes. -Switch the Rolaids for Pepcid OTC as needed -Again this should improve with weight loss.  We will follow-up in 6 months. Sooner if needed. I replaced a referral to GI for your screening colonoscopy. They will contact you to schedule.  It was very nice meeting you today. Welcome to [facility]!

## 2019-03-16 ENCOUNTER — Ambulatory Visit: Payer: BC Managed Care – PPO | Attending: Internal Medicine

## 2019-03-16 DIAGNOSIS — Z20822 Contact with and (suspected) exposure to covid-19: Secondary | ICD-10-CM

## 2019-03-17 LAB — NOVEL CORONAVIRUS, NAA: SARS-CoV-2, NAA: NOT DETECTED

## 2019-04-26 ENCOUNTER — Encounter: Payer: Self-pay | Admitting: Physician Assistant

## 2019-07-06 ENCOUNTER — Encounter: Payer: Self-pay | Admitting: Physician Assistant

## 2019-08-30 ENCOUNTER — Other Ambulatory Visit: Payer: Self-pay | Admitting: Physician Assistant

## 2019-08-30 DIAGNOSIS — I10 Essential (primary) hypertension: Secondary | ICD-10-CM

## 2019-09-08 ENCOUNTER — Ambulatory Visit: Payer: BC Managed Care – PPO | Admitting: Physician Assistant

## 2019-09-08 DIAGNOSIS — Z0289 Encounter for other administrative examinations: Secondary | ICD-10-CM

## 2019-11-24 ENCOUNTER — Other Ambulatory Visit: Payer: Self-pay | Admitting: Physician Assistant

## 2019-11-24 DIAGNOSIS — I10 Essential (primary) hypertension: Secondary | ICD-10-CM

## 2019-12-12 IMAGING — US US ABDOMEN LIMITED
1 series · 14 of 25 positions shown · non-contrast
Comparison: CT 04/02/2017

CLINICAL DATA: 46-year-old with epigastric pain for 6 months.

EXAM:
ULTRASOUND ABDOMEN LIMITED RIGHT UPPER QUADRANT

[Series 1: us abdomen limited · 0.25mm/px · 14 of 50 slices shown]
[im 1/50]
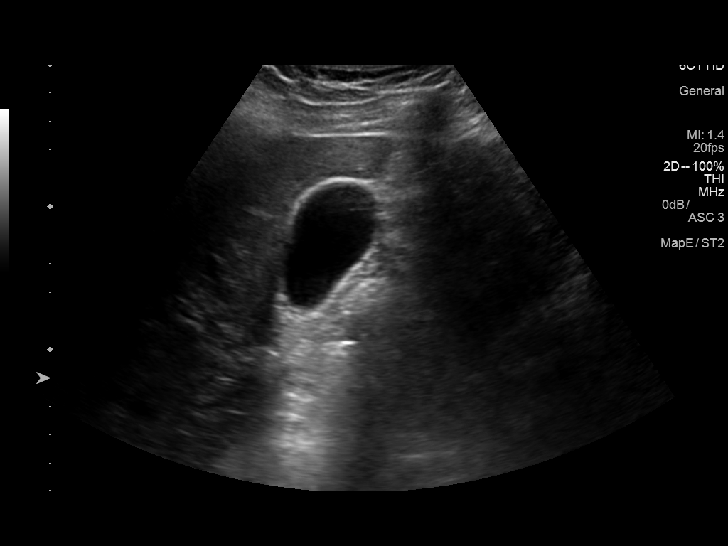
[im 5/50]
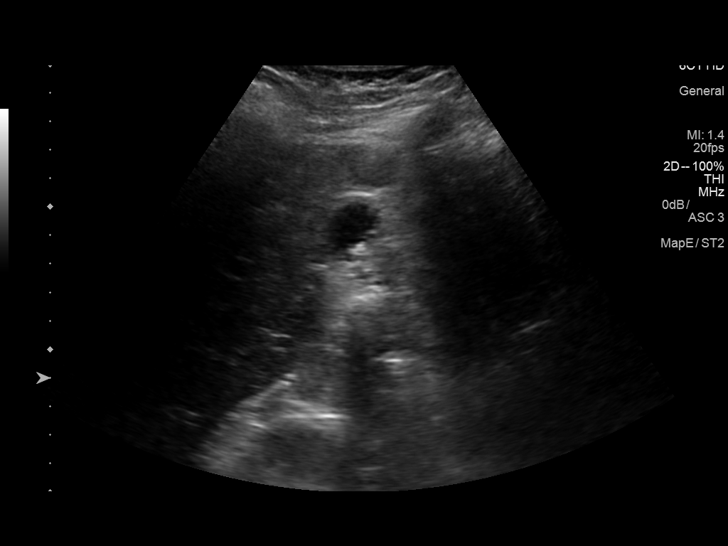
[im 9/50]
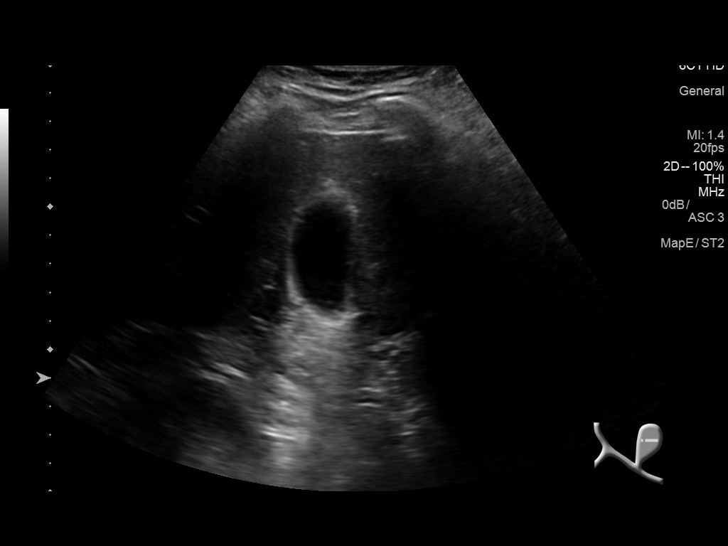
[im 13/50]
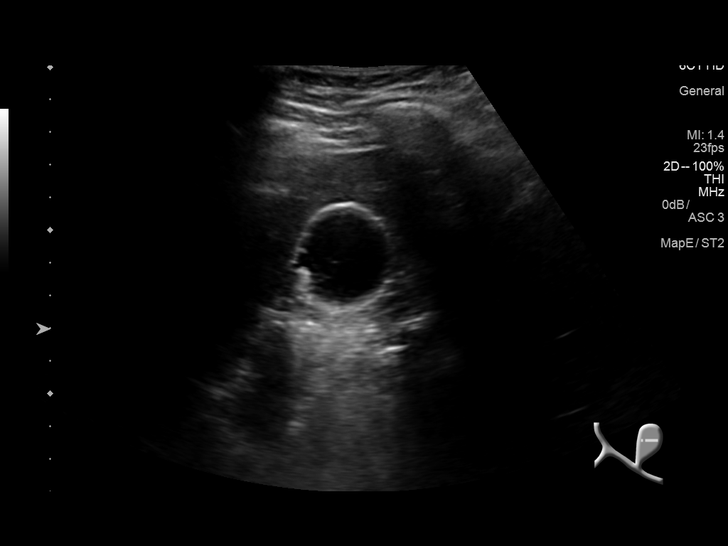
[im 17/50]
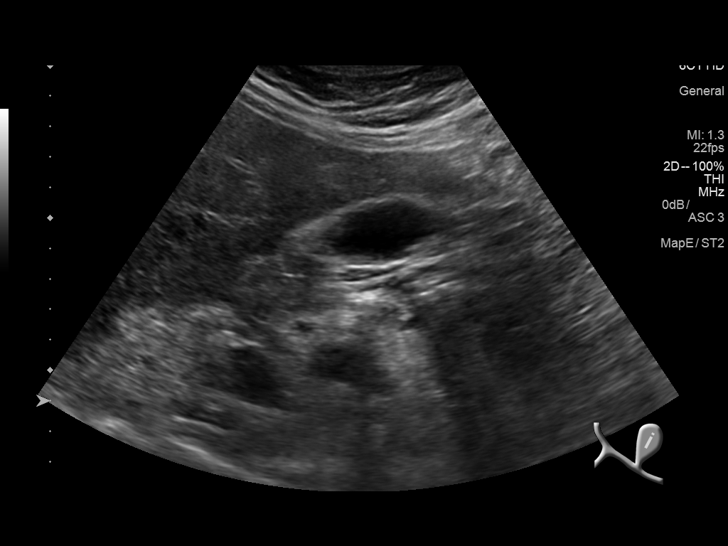
[im 19/50]
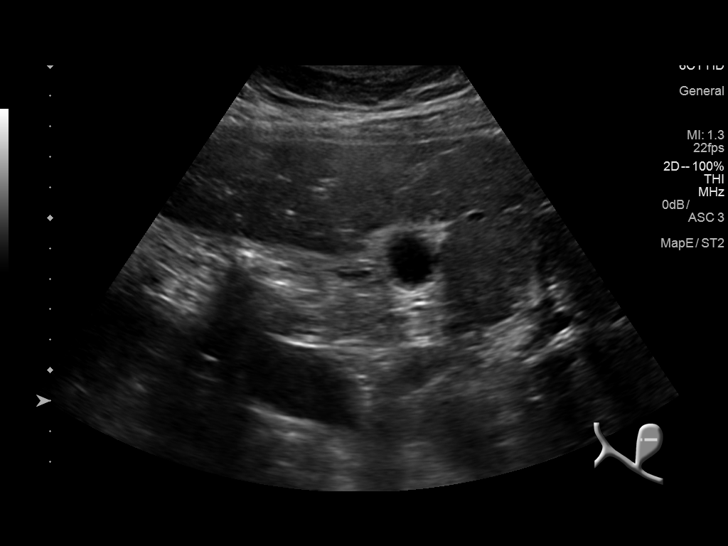
[im 23/50]
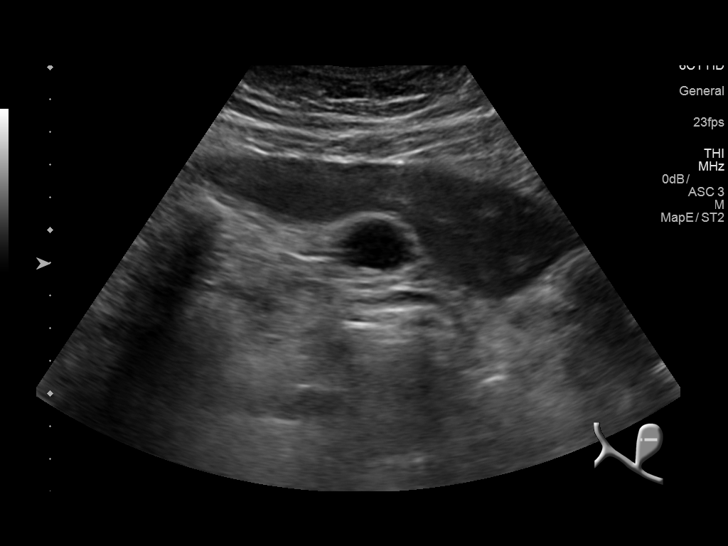
[im 27/50]
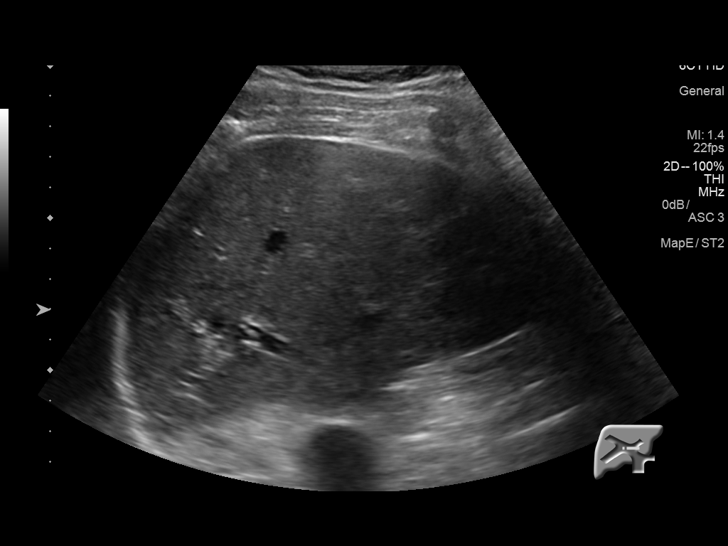
[im 31/50]
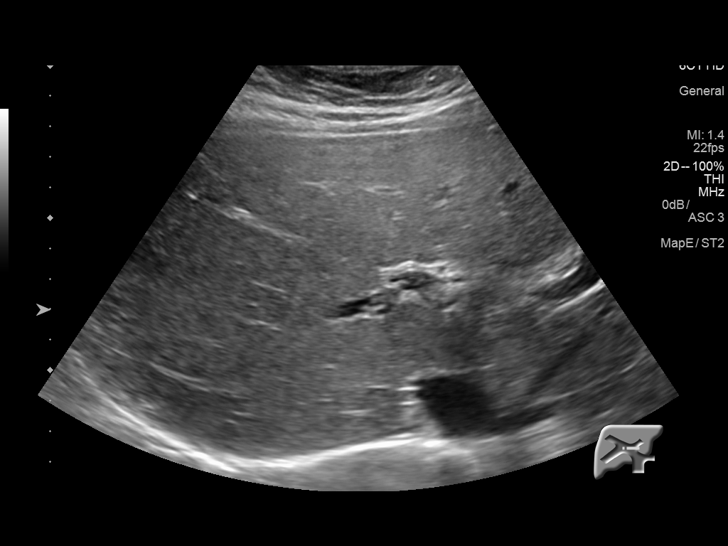
[im 33/50]
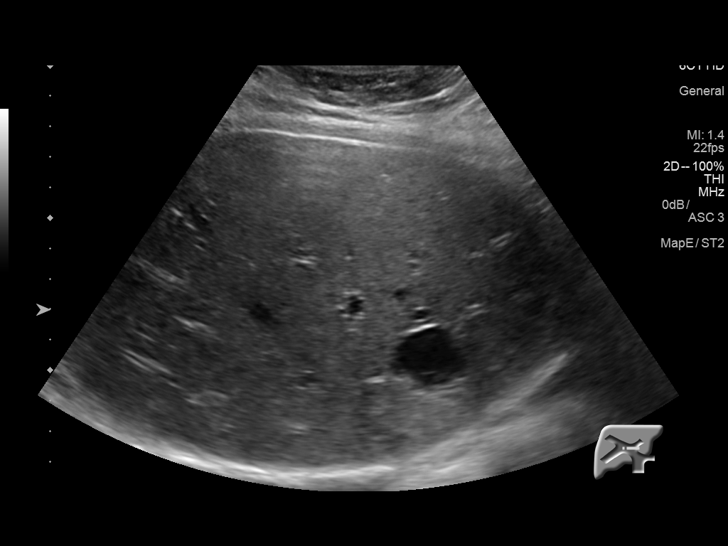
[im 37/50]
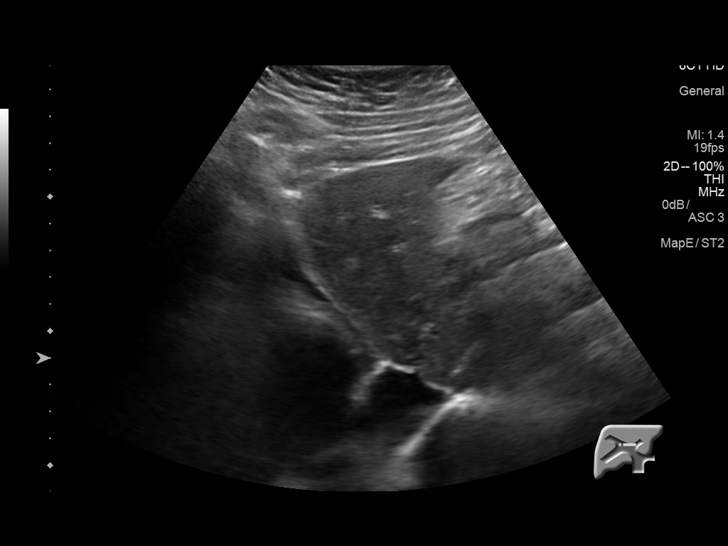
[im 41/50]
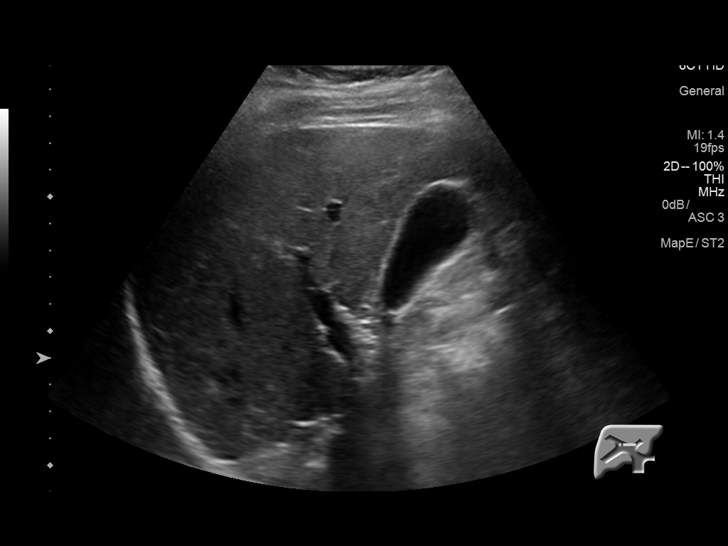
[im 45/50]
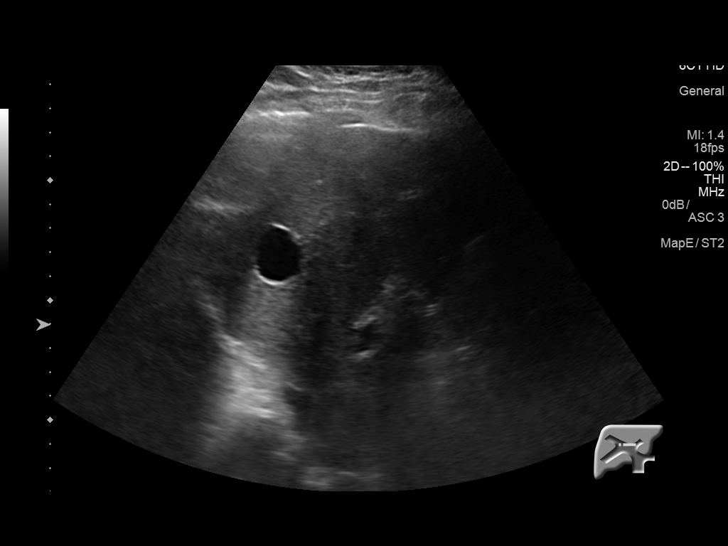
[im 50/50]
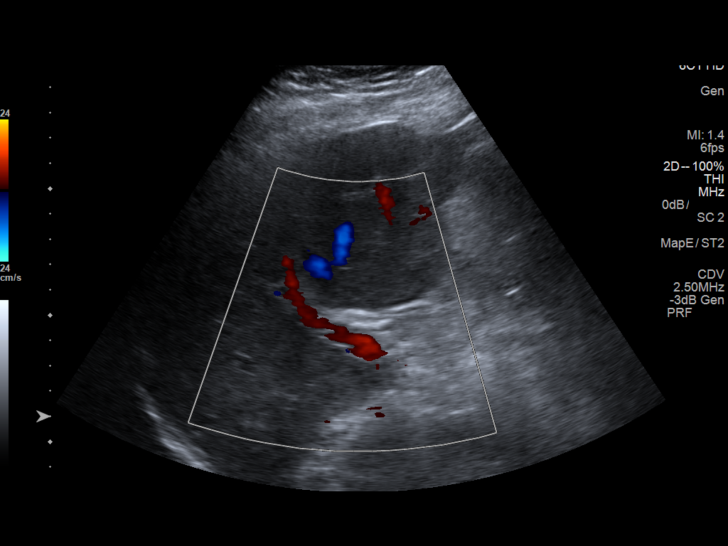

[14 of 25 positions shown; findings below may reference images not displayed]

FINDINGS: Gallbladder:

Echogenic foci in the gallbladder which could represent small stones
and/or polyps. Largest echogenic focus measures 0.8 cm. No
gallbladder wall thickening or distention. No sonographic Murphy
sign.

Common bile duct:

Diameter: 0.5 cm

Liver:

Anechoic cyst near the right hepatic dome measuring up to 2.4 cm.
Normal echotexture in the liver. Portal vein is patent on color
Doppler imaging with normal direction of blood flow towards the
liver.
IMPRESSION: Small echogenic foci in the gallbladder. Findings are compatible
with small stones and/or polyps. No evidence for acute gallbladder
inflammation.

Hepatic cyst.

## 2019-12-25 ENCOUNTER — Other Ambulatory Visit: Payer: Self-pay | Admitting: Physician Assistant

## 2019-12-25 DIAGNOSIS — I10 Essential (primary) hypertension: Secondary | ICD-10-CM

## 2020-01-12 IMAGING — US US THYROID
1 series · 13 of 25 positions shown · non-contrast
Comparison: None.

CLINICAL DATA: Incidental on CT.

EXAM:
THYROID ULTRASOUND
TECHNIQUE: Ultrasound examination of the thyroid gland and adjacent soft
tissues was performed.

[Series 1: us thyroid · 0.06mm/px · 13 of 58 slices shown]
[im 1/58]
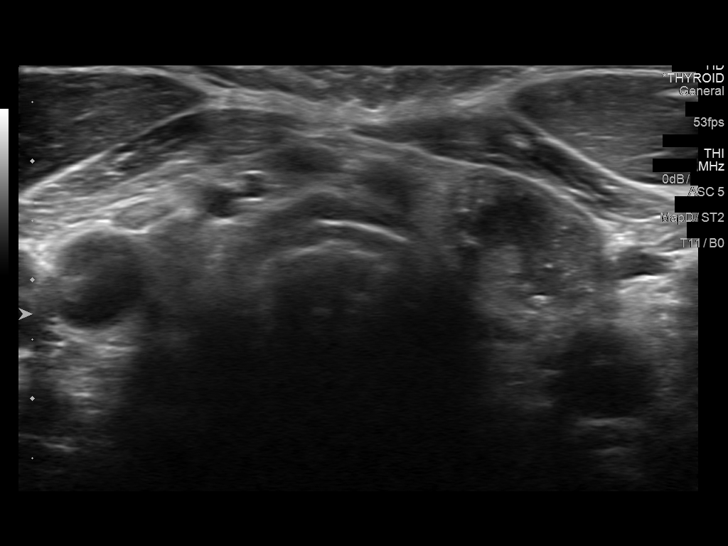
[im 5/58]
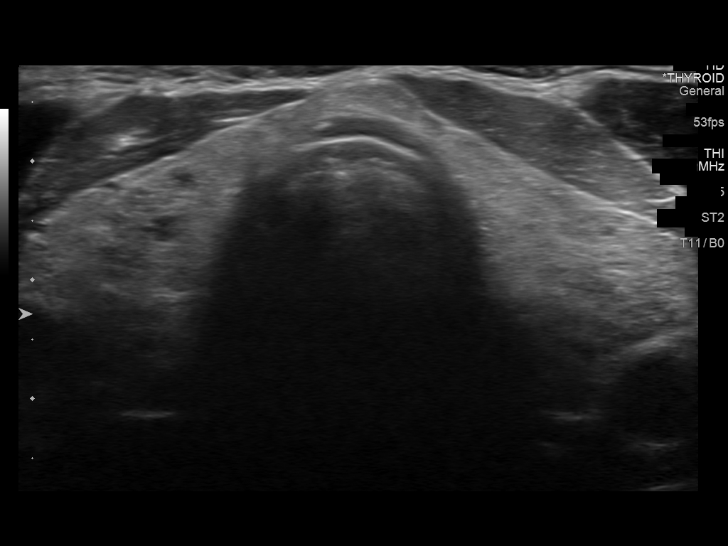
[im 10/58]
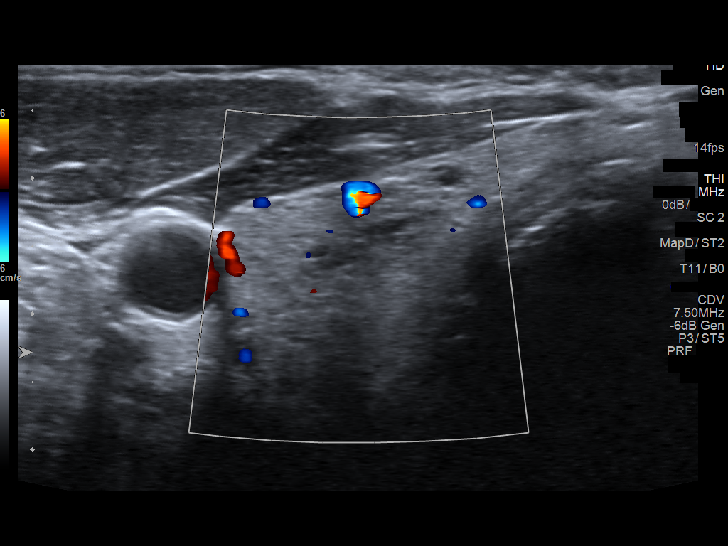
[im 15/58]
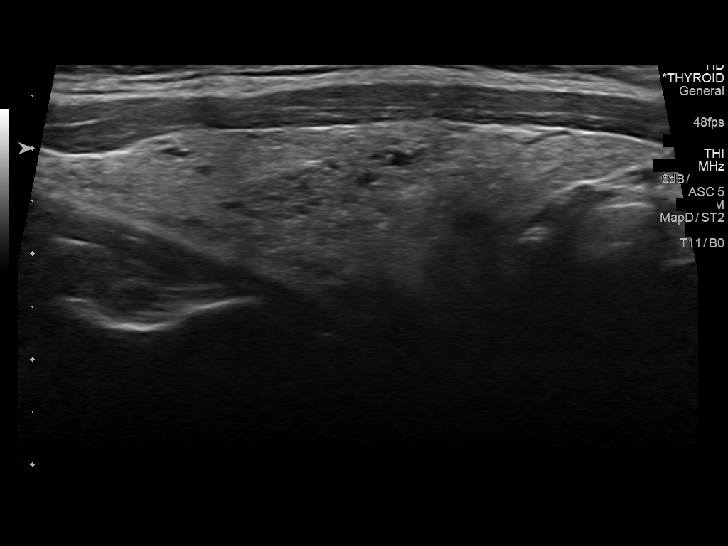
[im 20/58]
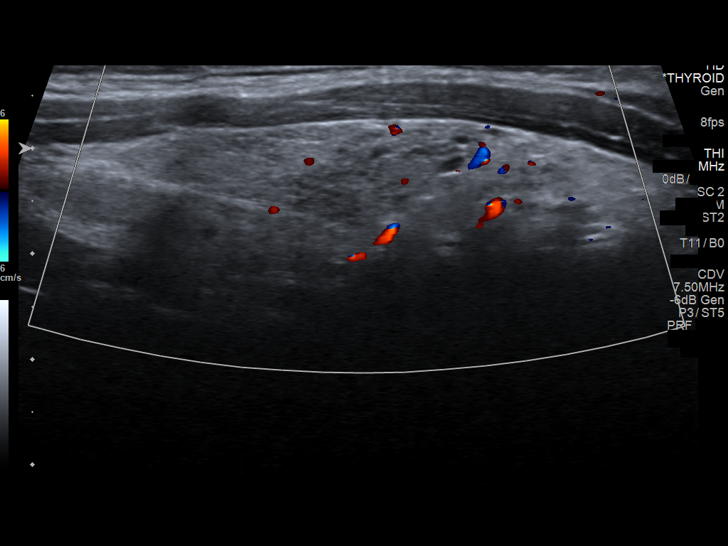
[im 24/58]
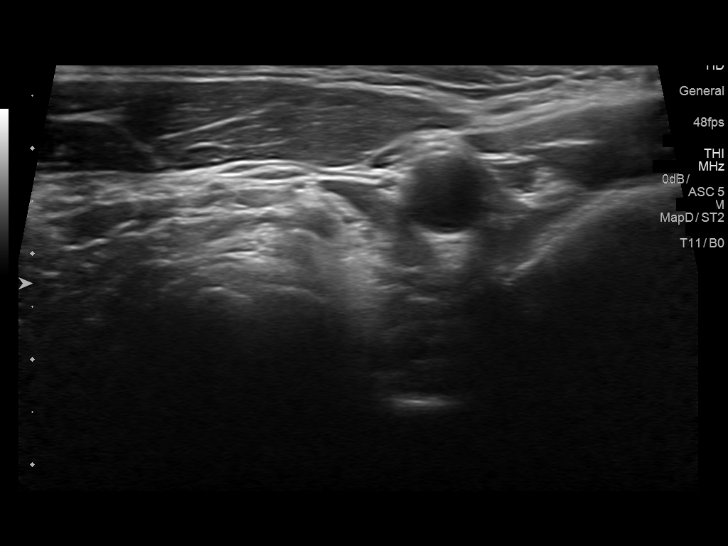
[im 29/58]
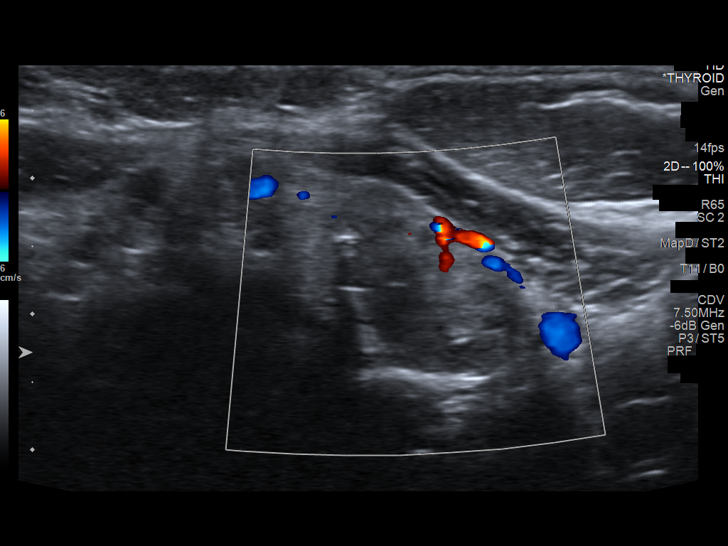
[im 34/58]
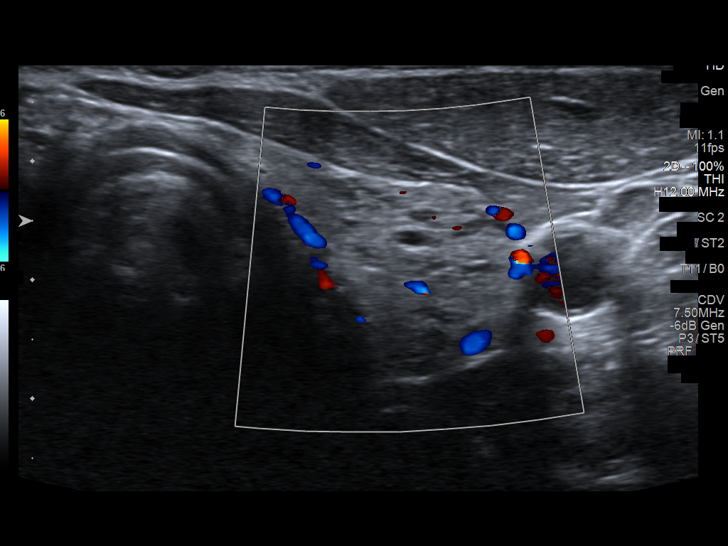
[im 39/58]
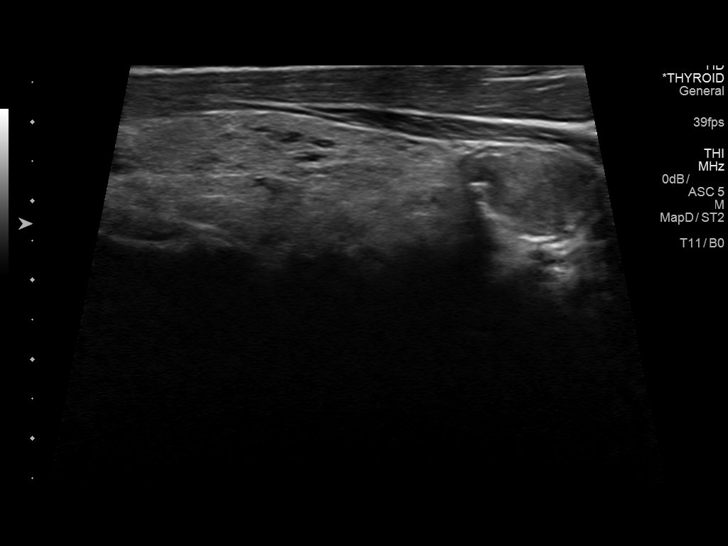
[im 43/58]
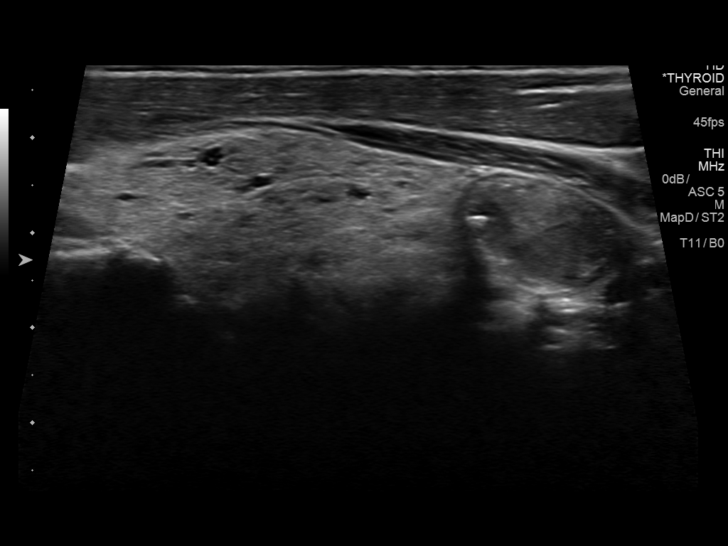
[im 48/58]
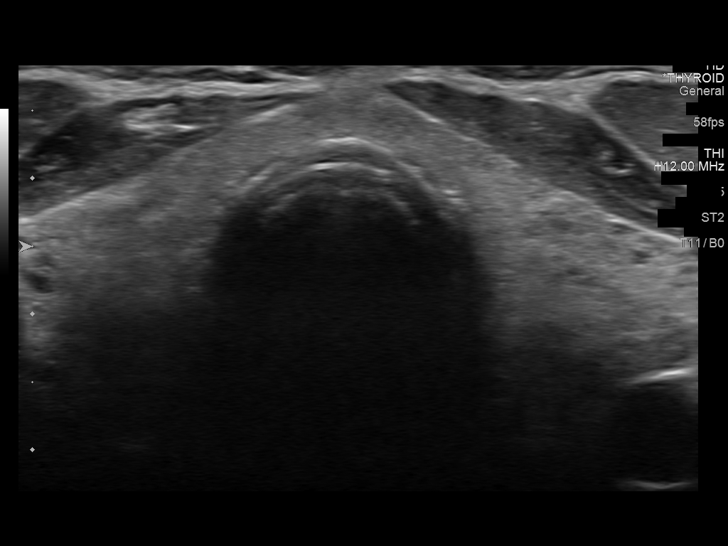
[im 53/58]
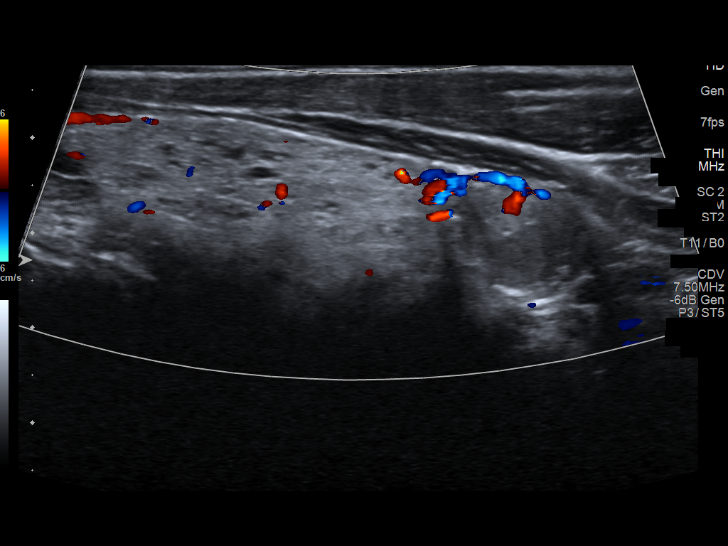
[im 58/58]
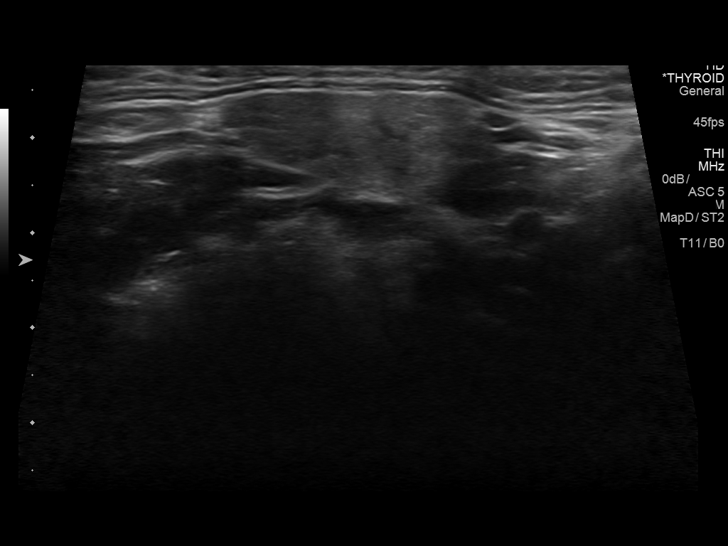

[13 of 25 positions shown; findings below may reference images not displayed]

FINDINGS: Parenchymal Echotexture: Moderately heterogenous

Isthmus: 0.4 cm

Right lobe: 6.2 x 1.7 x 2.0 cm

Left lobe: 6.6 x 2.2 x 1.9 cm

_________________________________________________________

Estimated total number of nodules >/= 1 cm: 1

Number of spongiform nodules >/=  2 cm not described below (TR1): 0

Number of mixed cystic and solid nodules >/= 1.5 cm not described
below (TR2): 0

_________________________________________________________

Nodule # 1:

Location: Left; Inferior

Maximum size: 1.8 cm; Other 2 dimensions: 1.3 x 1.3 cm

Composition: solid/almost completely solid (2)

Echogenicity: hypoechoic (2)

Shape: not taller-than-wide (0)

Margins: smooth (0)

Echogenic foci: macrocalcifications (1)

ACR TI-RADS total points: 5.

ACR TI-RADS risk category: TR4 (4-6 points).

ACR TI-RADS recommendations:

**Given size (>/= 1.5 cm) and appearance, fine needle aspiration of
this moderately suspicious nodule should be considered based on
TI-RADS criteria.

_________________________________________________________
IMPRESSION: Left nodule 1 meets criteria for fine needle aspiration biopsy.

The above is in keeping with the ACR TI-RADS recommendations - [HOSPITAL] 9367;[DATE].

## 2020-05-04 IMAGING — US US FNA BIOPSY THYROID 1ST LESION
1 series · 13 of 14 positions shown · non-contrast
Comparison: Ultrasound done 07/17/2017

MEDICATIONS:
1% Lidocaine = 4 mL

COMPLICATIONS:
None immediate.

INDICATION: Indeterminate left thyroid nodule

EXAM:
ULTRASOUND GUIDED FINE NEEDLE ASPIRATION OF INDETERMINATE THYROID
NODULE
TECHNIQUE: Informed written consent was obtained from the patient after a
discussion of the risks, benefits and alternatives to treatment.
Questions regarding the procedure were encouraged and answered. A
timeout was performed prior to the initiation of the procedure.

[Series 1: us fna biopsy thyroid 1st lesion · 0.06mm/px · 14 acquisitions, 13 frames shown]
[im 1/14]
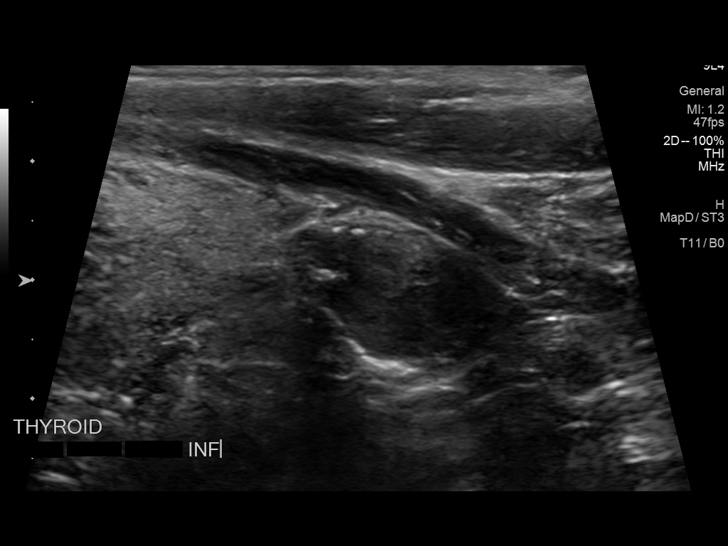
[im 2/14]
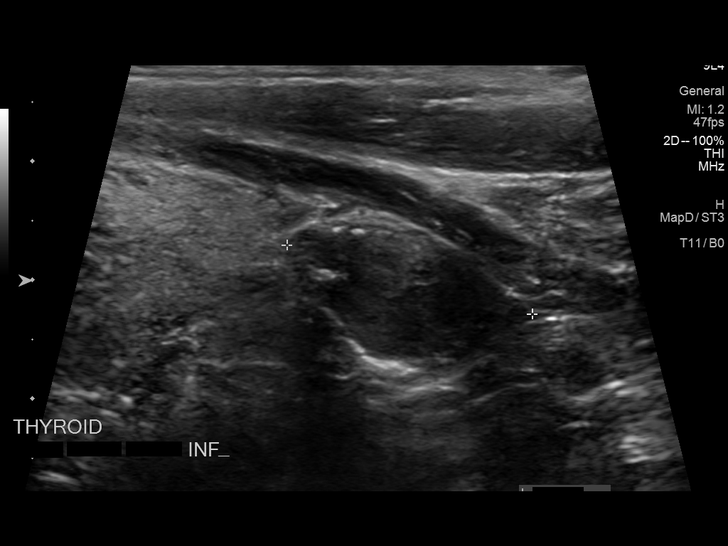
[im 3/14]
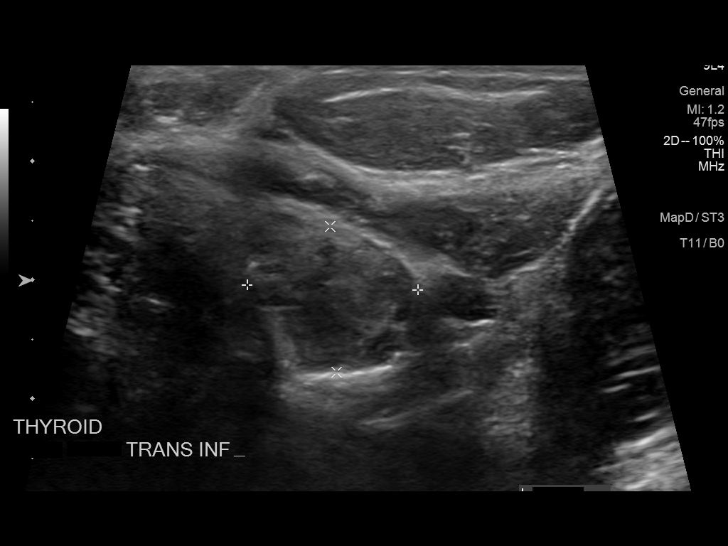
[im 4/14]
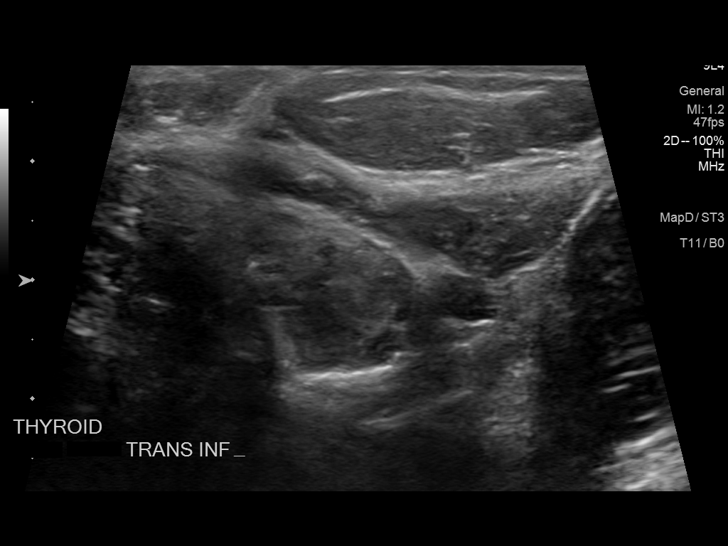
[im 5/14]
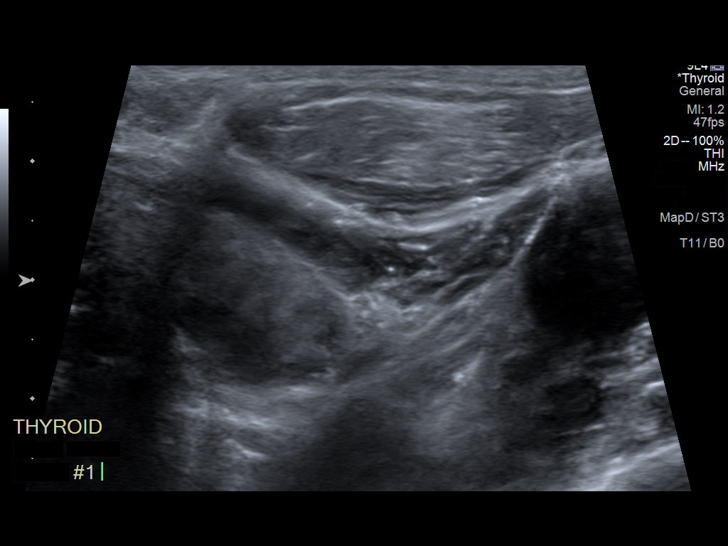
[im 6/14]
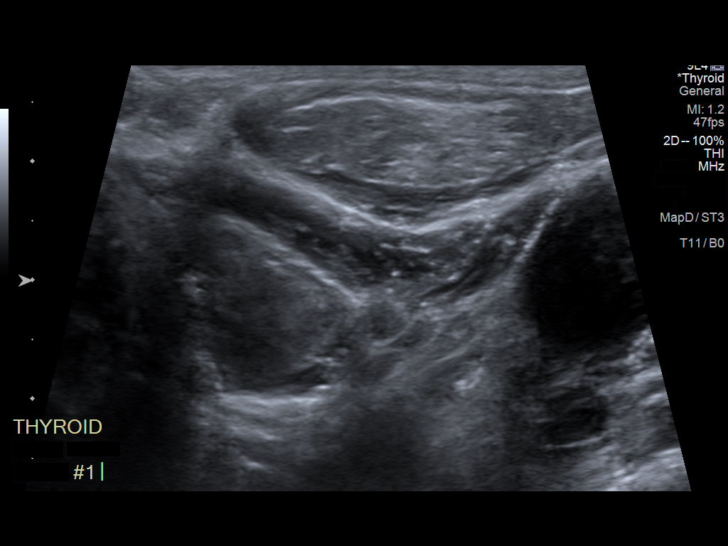
[im 8/14]
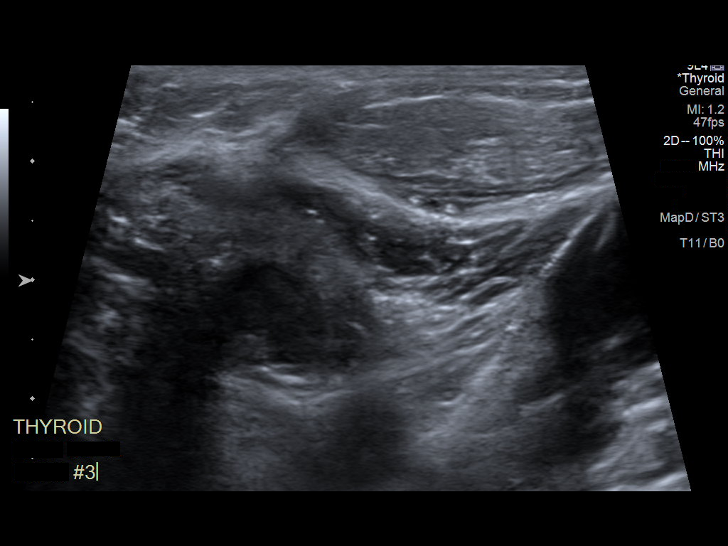
[im 9/14]
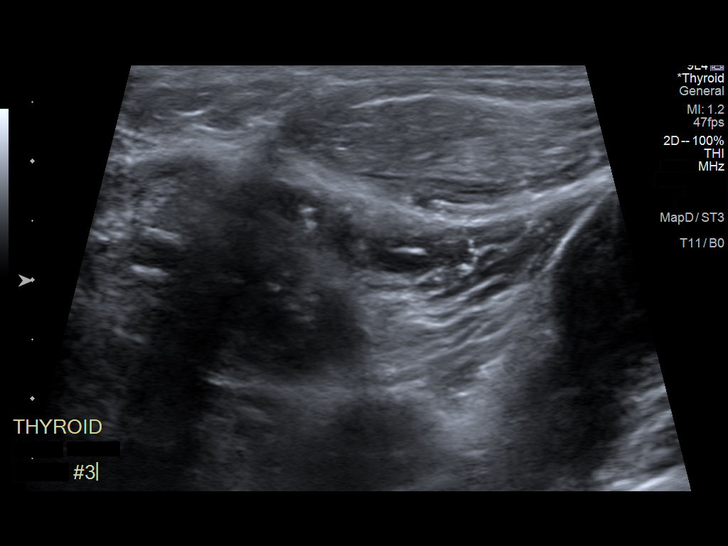
[im 10/14]
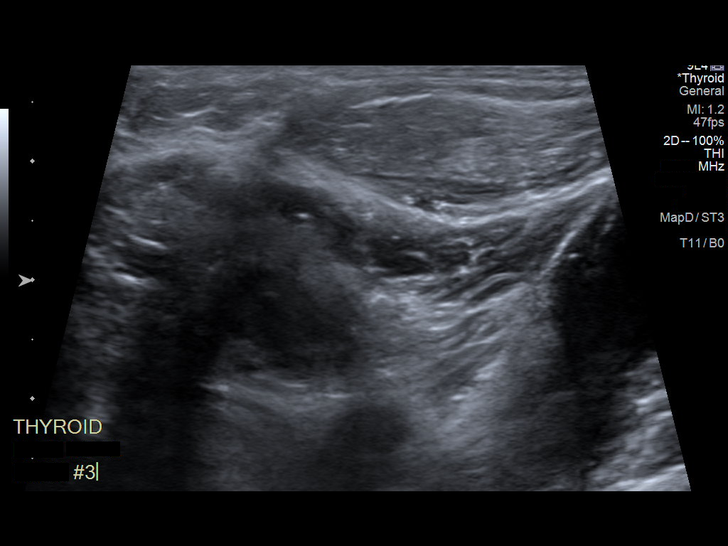
[im 11/14]
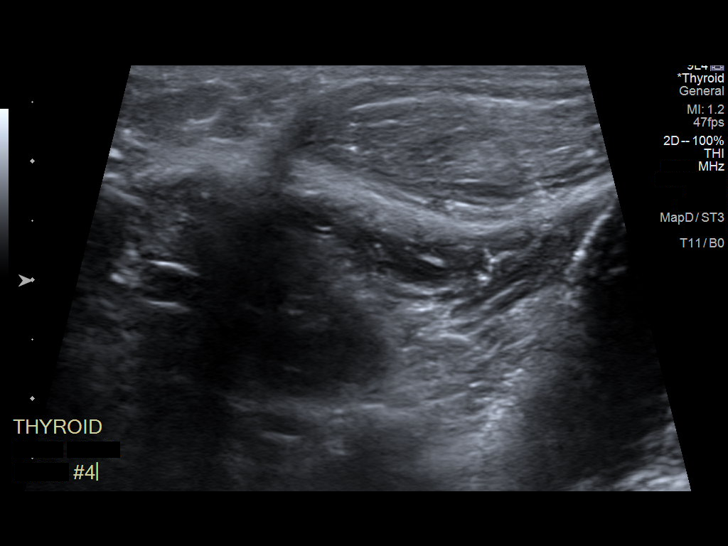
[im 12/14]
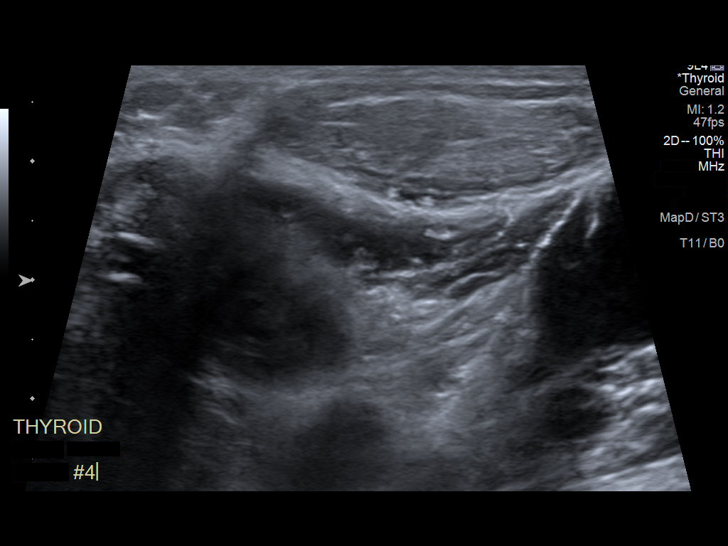
[im 13/14]
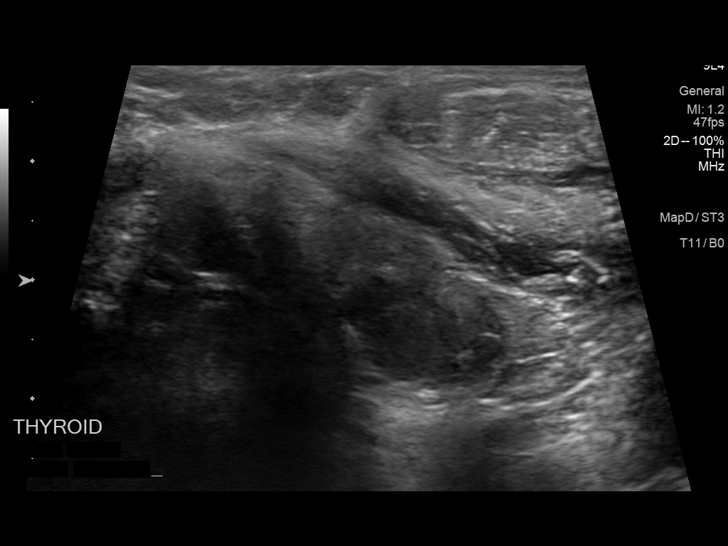
[im 14/14]
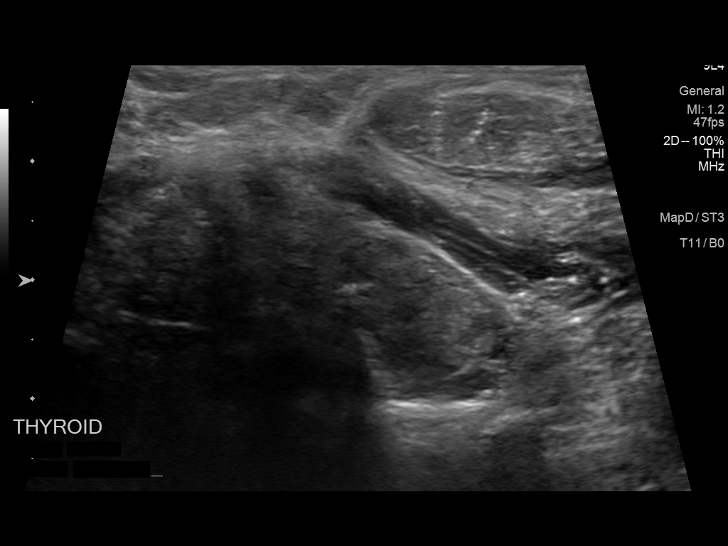

[13 of 14 positions shown; findings below may reference images not displayed]

Pre-procedural ultrasound scanning demonstrated unchanged size and
appearance of the indeterminate nodule within the left lobe of the
thyroid.

The procedure was planned. The neck was prepped in the usual sterile
fashion, and a sterile drape was applied covering the operative
field. A timeout was performed prior to the initiation of the
procedure. Local anesthesia was provided with 1% lidocaine.

Under direct ultrasound guidance, 4 FNA biopsies were performed of
the left thyroid nodule with a 25 gauge needle. Multiple ultrasound
images were saved for procedural documentation purposes. The samples
were prepared and submitted to pathology.

Limited post procedural scanning was negative for hematoma or
additional complication. Dressings were placed. The patient
tolerated the above procedures procedure well without immediate
postprocedural complication.
FINDINGS: Nodule reference number based on prior diagnostic ultrasound: 1

Maximum size: 1.8 cm

Location: Left; Inferior

ACR TI-RADS risk category: TR4 (4-6 points)

Reason for biopsy: meets ACR TI-RADS criteria

Ultrasound imaging confirms appropriate placement of the needles
within the thyroid nodule.
IMPRESSION: Technically successful ultrasound guided fine needle aspiration of
the left thyroid nodule.

## 2021-06-21 IMAGING — US US THYROID
1 series · 13 of 25 positions shown · non-contrast
Comparison: 07/17/2017, biopsy 07/29/2017

CLINICAL DATA: 47-year-old male with a history thyroid nodules.

Prior biopsy performed 07/29/2017
EXAM:
THYROID ULTRASOUND
TECHNIQUE: Ultrasound examination of the thyroid gland and adjacent soft
tissues was performed.

[Series 1: us thyroid · 0.07mm/px · 13 of 73 slices shown]
[im 1/73]
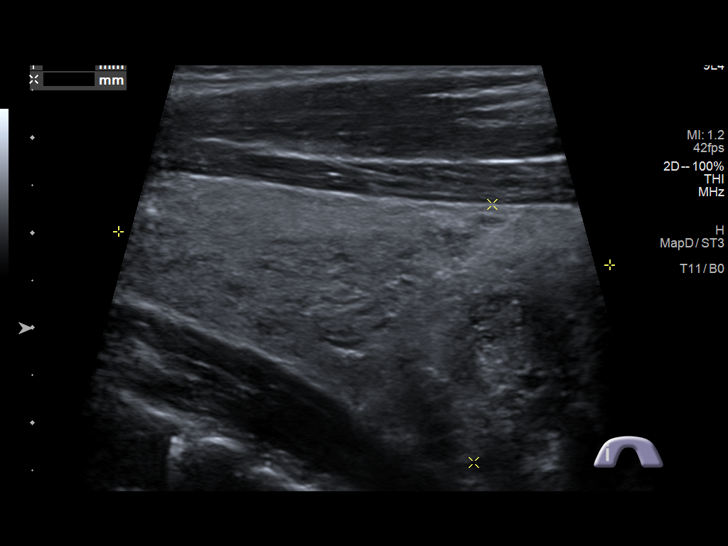
[im 7/73]
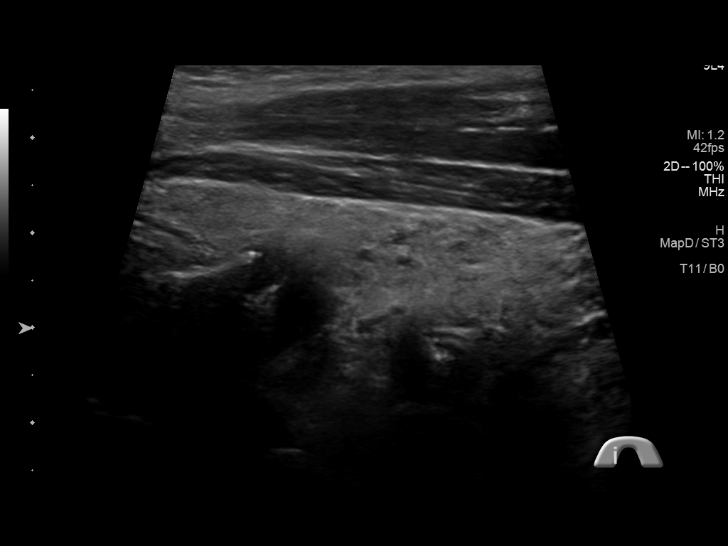
[im 13/73]
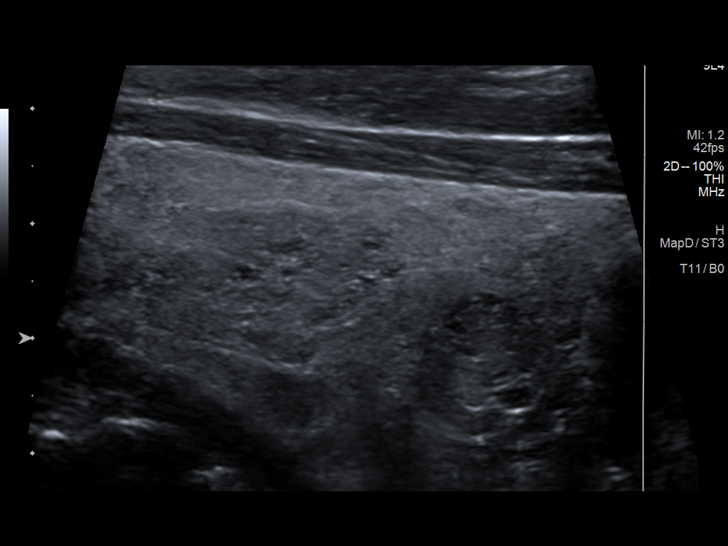
[im 19/73]
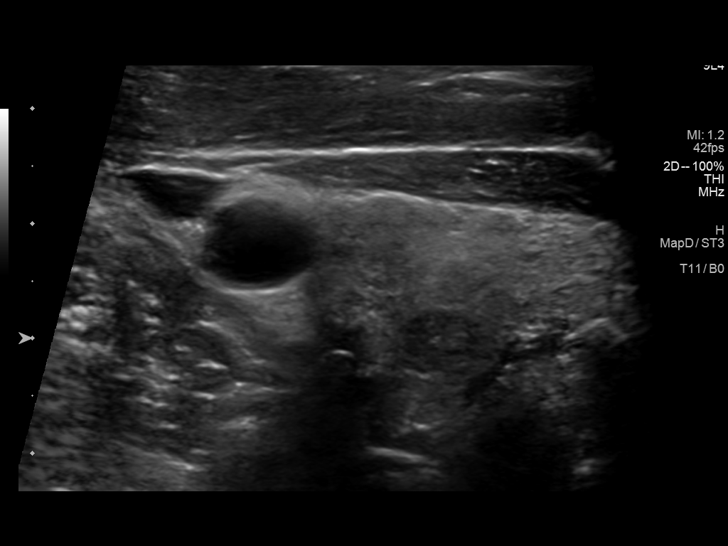
[im 25/73]
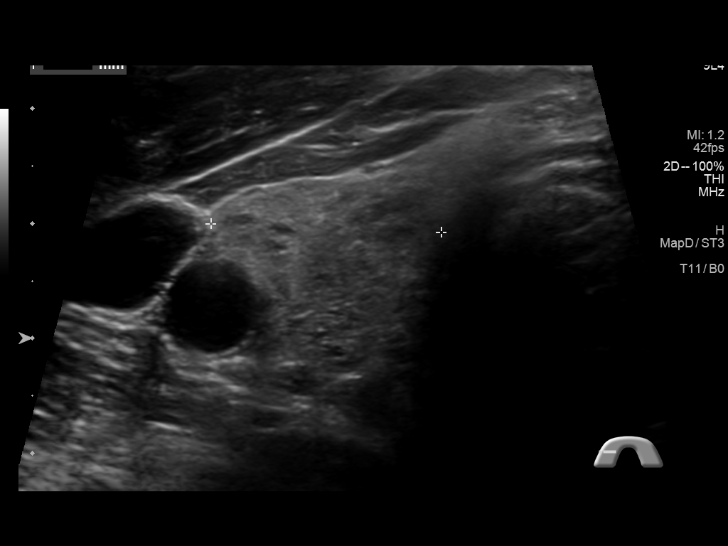
[im 31/73]
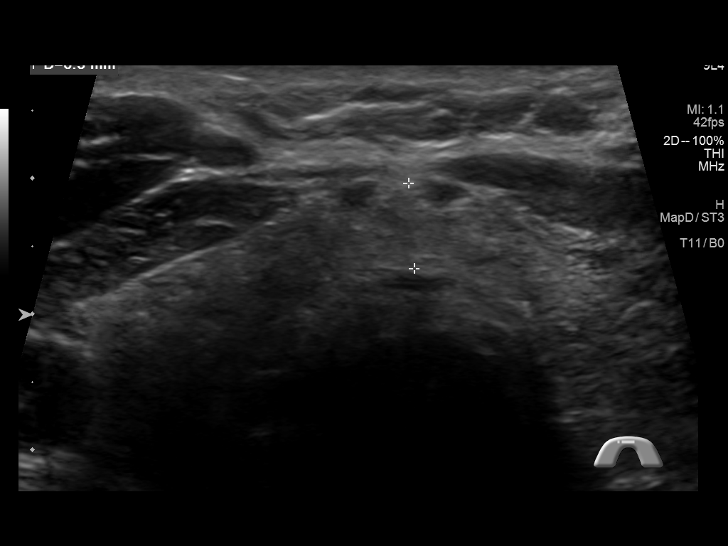
[im 37/73]
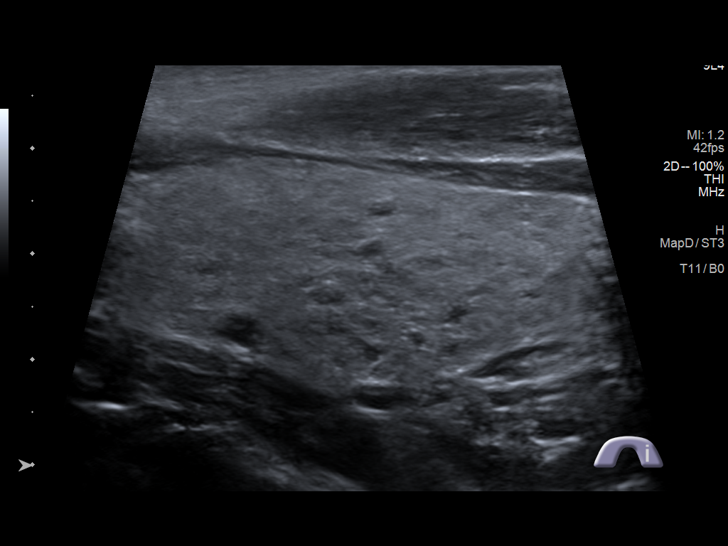
[im 43/73]
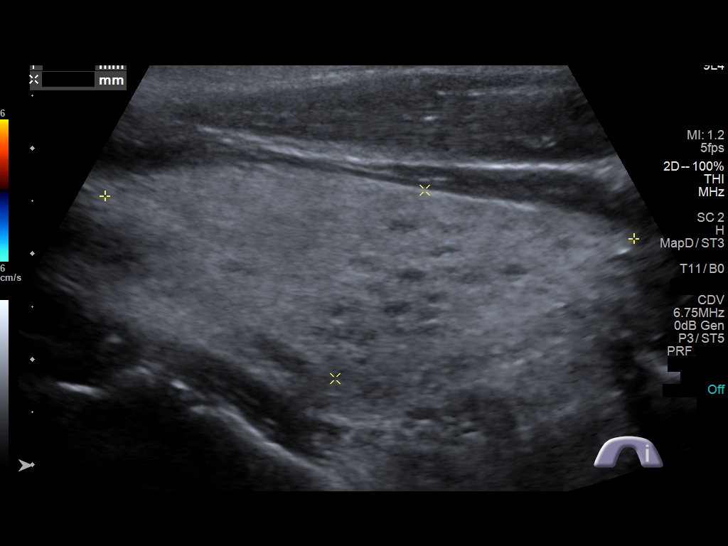
[im 49/73]
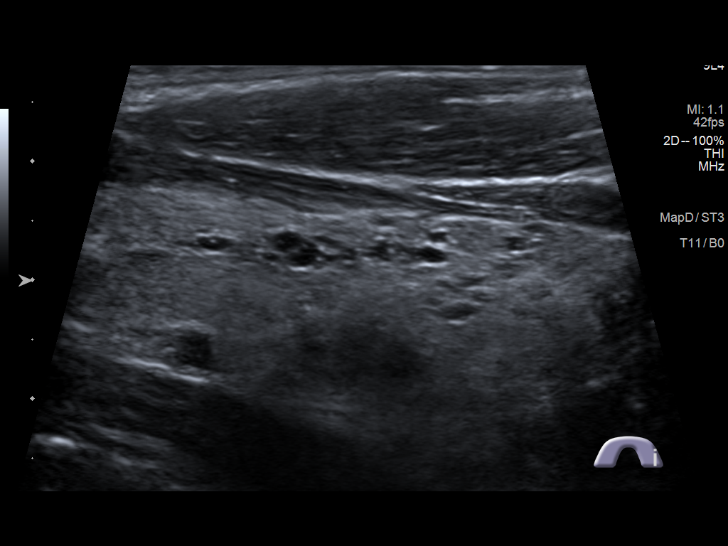
[im 55/73]
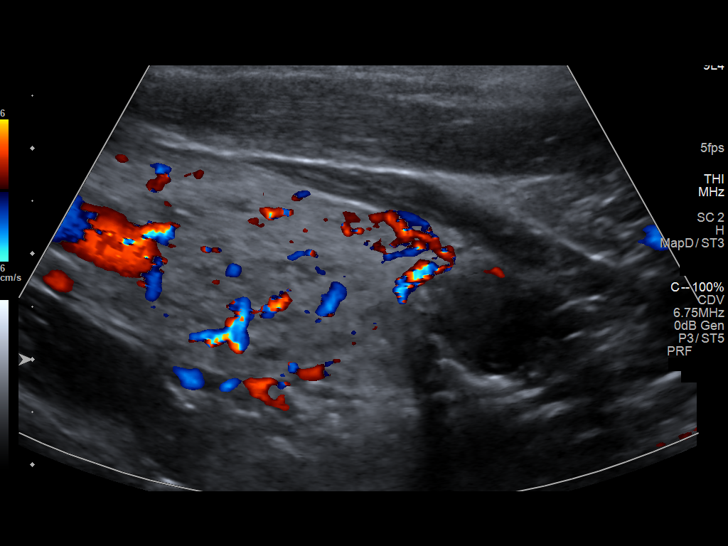
[im 61/73]
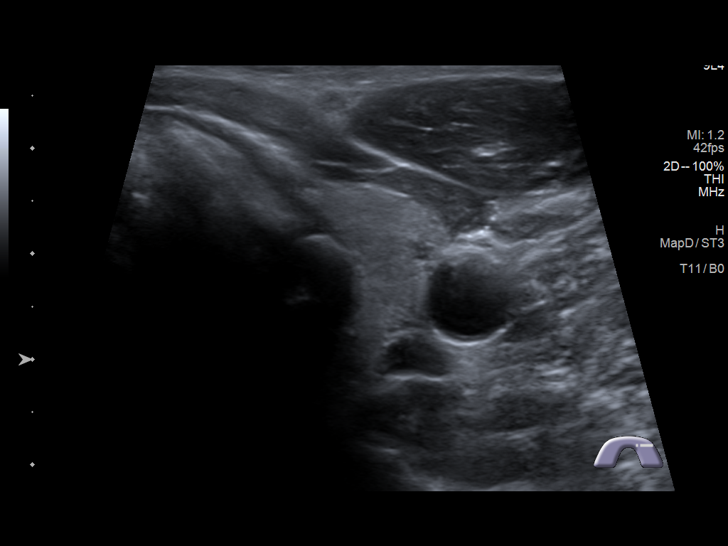
[im 67/73]
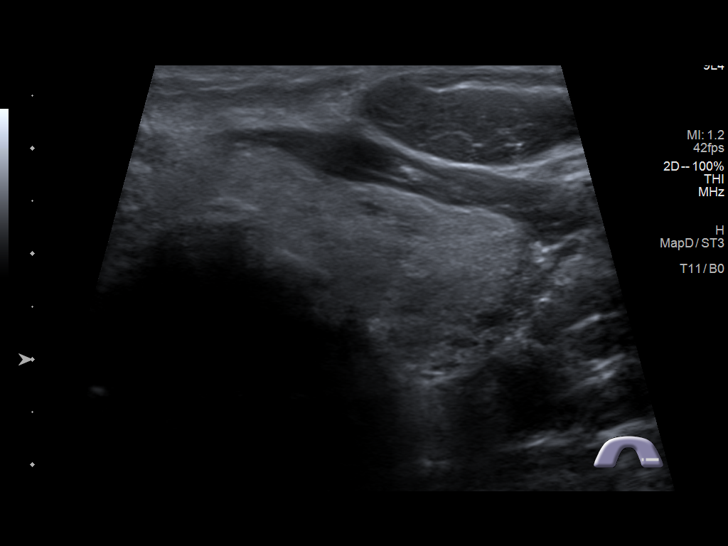
[im 73/73]
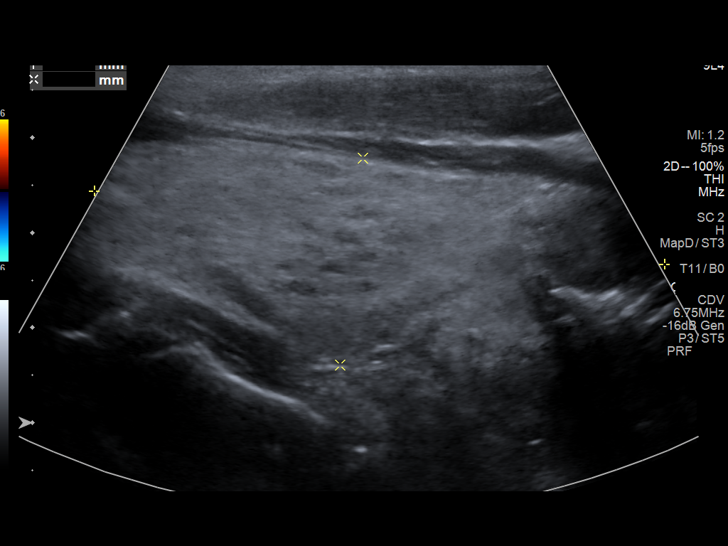

[13 of 25 positions shown; findings below may reference images not displayed]

FINDINGS: Parenchymal Echotexture: Mildly heterogenous

Isthmus: 0.6 cm

Right lobe: 5.8 cm x 2.6 cm x 2.0 cm

Left lobe: 6.1 cm x 2.2 cm x 2.0 cm

_________________________________________________________

Estimated total number of nodules >/= 1 cm: 1

Number of spongiform nodules >/=  2 cm not described below (TR1): 0

Number of mixed cystic and solid nodules >/= 1.5 cm not described
below (TR2): 0

_________________________________________________________

Nodule # 1:

Location: Right; Inferior

Maximum size: 0.7 cm; Other 2 dimensions: 0.6 cm x 0.6 cm

Composition: cannot determine (2)

Echogenicity: isoechoic (1)

Shape: not taller-than-wide (0)

Margins: ill-defined (0)

Echogenic foci: none (0)

ACR TI-RADS total points: 3.

ACR TI-RADS risk category: TR3 (3 points).

ACR TI-RADS recommendations:

Nodule does not meet criteria for surveillance or biopsy

_________________________________________________________

The left thyroid nodule labeled 2 decreased in size from the prior,
currently 1.6 cm, previously 1.8 cm. Nodule is been previously
biopsied.
IMPRESSION: The left inferior thyroid nodule, previously biopsied, is decreasing
in size. Assuming a previously benign result, no further specific
follow-up would be recommended.

No thyroid nodule meets criteria for biopsy or surveillance, as
designated by the newly established ACR TI-RADS criteria.

Recommendations follow those established by the new ACR TI-RADS
criteria ([HOSPITAL] [id number];[DATE]).

## 2022-02-22 ENCOUNTER — Encounter: Payer: Self-pay | Admitting: Gastroenterology

## 2022-03-05 NOTE — H&P (View-Only) (Signed)
03/07/2022 Eugene Coleman [id number] 25-Sep-1970  Referring provider: Brunetta Jeans, PA-C Primary GI doctor: Dr. Fuller Plan  ASSESSMENT AND PLAN:   Globus sensation, history of GERD, denies dysphagia With significant GERD seen on upper GI study in 2020 and continuing symptoms we will plan for EGD to evaluate for reflux, EOE, esophagitis, H. pylori, Barrett's esophagus. Patient has history of difficult intubation at age 51, will schedule with Dr. Fuller Plan in the hospital with a colonoscopy, first available January 25. EGD recommended to evaluate for gastroesophageal reflux, gastric inlet pouch in the proximal esophagus, and eosinophilic esophagitis as the cause of symptoms.   Lifestyle modifications discussed with patient and given handout. Trial of daily PPI therapy, Protonix 40 mg. Consider amitriptyline 25 mg daily if no response to PPI as it may be increased with stress. If upper endoscopy is negative and no response to PPI therapy, consider esophageal manometry, or esophageal impedance,  Right upper quadrant abdominal pain  sporadically worse with caffeine associated with nausea, pinching right shoulder pain. Normal abdominal ultrasound Has some right upper quadrant tenderness on exam negative Murphy's.  Will schedule for HIDA scan.  Colon cancer screening Has never had colonoscopy, will schedule at hospital with EGD We have discussed the risks of bleeding, infection, perforation, medication reactions, and remote risk of death associated with colonoscopy. All questions were answered and the patient acknowledges these risk and wishes to proceed.   Patient Care Team: Cena Benton as PCP - General (Family Medicine) [name], MD as Consulting Physician (General Surgery)  HISTORY OF PRESENT ILLNESS: 51 y.o. male with a past medical history of anxiety, hypertension, rheumatoid arthritis, IBS, SIBO and others listed below presents for evaluation of dysphagia.    06/2017 last office visit with Dr. Fuller Plan and some response to Xifaxan but also changed his FODMAP diet with improvement of symptoms.  Dicyclomine cause Drowsiness.  Due to difficult intubation age 89 prefers to not to be sedated. 06/2017 right upper quadrant ultrasound small stones/polyps no acute inflammation.   03/2016 CT abdomen pelvis unremarkable 06/2017 upper GI prominent GERD to level of lumbothoracic no worrisome esophageal changes given degree of reflux may benefit from endoscopic surveillance, patient declined at the time. Discussed considering HIDA  Patient states the discomfort is identical to what he had prior that brought him here in 2019.  Has had total of 4 major episodes since his 30's.  He states worse after caffeine which he drinks due to increasing work and stress.  He states will have flare after caffeine, and then once he stops caffeine will have 1-2 weeks of pain but then it improves.  He states the pain is RUQ pain, has pinching sensation in the back and then radiates to the left.  He has almost constant RUQ burning sensation in between the episodes.  Denies dysphagia with food, pills but just feels something constantly there in his throat. He has BM once or twice a day, in the AM, normally formed but with episodes or increased caffiene, has loose stools.  The IBGard helped as well and he just restarted that.  Causes neuropathy in his feet and "tightening" nerve in his head and causes headache.  He denies GERD but states he has constant tugging on his throat and feels something is there with swallowing like knot in his throat.    No melena, no hematochezia. Did take pepto for 3 days and had black stools with that.  He has bloating.  He states  he has gallbladder issues and drinks beet juices.   No smoking, no NSAIDS, no ETOH.  Labs reviewed from PCP 2 weeks ago, had normal CBC and CMET, no elevated LFTs.   He  reports that he has never smoked. He quit smokeless  tobacco use about 23 years ago. He reports that he does not drink alcohol and does not use drugs.  Current Medications:    Current Outpatient Medications (Cardiovascular):    amLODipine (NORVASC) 5 MG tablet, Take 1 tablet (5 mg total) by mouth daily. No further refills without follow-up. (Patient not taking: Reported on 03/07/2022)     Current Outpatient Medications (Other):    cholecalciferol (VITAMIN D3) 25 MCG (1000 UT) tablet, Take 1,000 Units by mouth 2 (two) times a week.    Na Sulfate-K Sulfate-Mg Sulf 17.5-3.13-1.6 GM/177ML SOLN, Take 1 kit by mouth once for 1 dose.   Omega-3 Fatty Acids (FISH OIL) 1000 MG CAPS, Take 1,000 mg by mouth 2 (two) times a week.    pantoprazole (PROTONIX) 40 MG tablet, Take 1 tablet (40 mg total) by mouth daily.   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   Multiple thyroid nodules 07/10/2017   Rheumatoid factor positive with cyclic citrullinated peptide (CCP) antibody negative 04/29/2017   Right thyroid nodule 07/10/2017   Small intestinal bacterial overgrowth (SIBO)    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 No Did it involve sudden or severe rash/hives, skin peeling, or any reaction on the inside of your mouth or nose? No Did you need to seek medical attention at a hospital or doctor's office? No When did it last happen?      Childhood If all above answers are "NO", may proceed with cephalosporin use.     Surgical History:  He  has a past surgical history that includes Knee surgery (Bilateral, [phone]); Tonsillectomy; Wisdom tooth extraction; Inguinal hernia repair (Bilateral); Vasectomy; and Hernia repair. Family History:  His family history includes Breast cancer in his mother; Diverticulitis in his mother; Heart attack in his maternal grandfather and paternal grandfather; Hypertension in his father; Leukemia in his father; Lupus in his sister; Stroke in his paternal grandfather; Thyroid disease in his cousin.  REVIEW OF SYSTEMS  : All other systems reviewed and negative except where noted in the History of Present Illness.  PHYSICAL EXAM: BP (!) 140/90   Pulse 78   Ht _0  (1.905 m)   Wt 193 lb (87.5 kg)   SpO2 98%   BMI 24.12 kg/m  General:  Pleasant, well developed male in no acute distress Head:   Normocephalic and atraumatic.  No lymphadenopathy Eyes:  sclerae anicteric,conjunctive pink  Heart:   regular rate and rhythm Pulm:  Clear anteriorly; no wheezing Abdomen:   Soft, Obese AB, Active bowel sounds. mild tenderness in the RUQ. Without guarding and Without rebound, negative Murphy no organomegaly appreciated. Rectal: Not evaluated Extremities:  Without edema. Msk: Symmetrical without gross deformities. Peripheral pulses intact.  Neurologic:  Alert and  oriented x4;  No focal deficits.  Skin:   Dry and intact without significant lesions or rashes. Psychiatric:  Cooperative. Normal mood and affect.  RELEVANT LABS AND IMAGING: CBC    Component Value Date/Time   WBC 7.3  09/15/2018 0944   RBC 5.09 09/15/2018 0944   HGB 15.5 09/15/2018 0944   HCT 46.1 09/15/2018 0944   PLT 253 09/15/2018 0944   MCV 90.6 09/15/2018 0944   MCH 30.5 09/15/2018 0944   MCHC 33.6 09/15/2018 0944   RDW 12.4 09/15/2018 0944   LYMPHSABS 2.0 07/17/2017 1109   MONOABS 0.3 07/17/2017 1109   EOSABS 0.0 07/17/2017 1109   BASOSABS 0.0 07/17/2017 1109    CMP     Component Value Date/Time   NA 142 09/15/2018 0944   K 3.9 09/15/2018 0944   CL 107 09/15/2018 0944   CO2 28 09/15/2018 0944   GLUCOSE 99 09/15/2018 0944   BUN 22 09/15/2018 0944   CREATININE 0.86 09/15/2018 0944   CALCIUM 10.1 09/15/2018 0944   PROT 6.4 09/15/2018 0944   ALBUMIN 4.1 07/17/2017 1109   AST 19 09/15/2018 0944   ALT 36 09/15/2018 0944   ALKPHOS 46 07/17/2017 1109   BILITOT 0.5 09/15/2018 0944   GFRNONAA 102 09/15/2018 0944   GFRAA 118 09/15/2018 [place] Ancelmo Hunt, PA-C 2:43 PM

## 2022-03-05 NOTE — Progress Notes (Unsigned)
03/07/2022 Eugene Coleman [id number] 25-Sep-1970  Referring provider: Brunetta Jeans, PA-C Primary GI doctor: Dr. Fuller Plan  ASSESSMENT AND PLAN:   Globus sensation, history of GERD, denies dysphagia With significant GERD seen on upper GI study in 2020 and continuing symptoms we will plan for EGD to evaluate for reflux, EOE, esophagitis, H. pylori, Barrett's esophagus. Patient has history of difficult intubation at age 51, will schedule with Dr. Fuller Plan in the hospital with a colonoscopy, first available January 25. EGD recommended to evaluate for gastroesophageal reflux, gastric inlet pouch in the proximal esophagus, and eosinophilic esophagitis as the cause of symptoms.   Lifestyle modifications discussed with patient and given handout. Trial of daily PPI therapy, Protonix 40 mg. Consider amitriptyline 25 mg daily if no response to PPI as it may be increased with stress. If upper endoscopy is negative and no response to PPI therapy, consider esophageal manometry, or esophageal impedance,  Right upper quadrant abdominal pain  sporadically worse with caffeine associated with nausea, pinching right shoulder pain. Normal abdominal ultrasound Has some right upper quadrant tenderness on exam negative Murphy's.  Will schedule for HIDA scan.  Colon cancer screening Has never had colonoscopy, will schedule at hospital with EGD We have discussed the risks of bleeding, infection, perforation, medication reactions, and remote risk of death associated with colonoscopy. All questions were answered and the patient acknowledges these risk and wishes to proceed.   Patient Care Team: Eugene Coleman as PCP - General (Family Medicine) [name], MD as Consulting Physician (General Surgery)  HISTORY OF PRESENT ILLNESS: 51 y.o. male with a past medical history of anxiety, hypertension, rheumatoid arthritis, IBS, SIBO and others listed below presents for evaluation of dysphagia.    06/2017 last office visit with Dr. Fuller Plan and some response to Xifaxan but also changed his FODMAP diet with improvement of symptoms.  Dicyclomine cause Drowsiness.  Due to difficult intubation age 89 prefers to not to be sedated. 06/2017 right upper quadrant ultrasound small stones/polyps no acute inflammation.   03/2016 CT abdomen pelvis unremarkable 06/2017 upper GI prominent GERD to level of lumbothoracic no worrisome esophageal changes given degree of reflux may benefit from endoscopic surveillance, patient declined at the time. Discussed considering HIDA  Patient states the discomfort is identical to what he had prior that brought him here in 2019.  Has had total of 4 major episodes since his 30's.  He states worse after caffeine which he drinks due to increasing work and stress.  He states will have flare after caffeine, and then once he stops caffeine will have 1-2 weeks of pain but then it improves.  He states the pain is RUQ pain, has pinching sensation in the back and then radiates to the left.  He has almost constant RUQ burning sensation in between the episodes.  Denies dysphagia with food, pills but just feels something constantly there in his throat. He has BM once or twice a day, in the AM, normally formed but with episodes or increased caffiene, has loose stools.  The IBGard helped as well and he just restarted that.  Causes neuropathy in his feet and "tightening" nerve in his head and causes headache.  He denies GERD but states he has constant tugging on his throat and feels something is there with swallowing like knot in his throat.    No melena, no hematochezia. Did take pepto for 3 days and had black stools with that.  He has bloating.  He states  he has gallbladder issues and drinks beet juices.   No smoking, no NSAIDS, no ETOH.  Labs reviewed from PCP 2 weeks ago, had normal CBC and CMET, no elevated LFTs.   He  reports that he has never smoked. He quit smokeless  tobacco use about 23 years ago. He reports that he does not drink alcohol and does not use drugs.  Current Medications:    Current Outpatient Medications (Cardiovascular):    amLODipine (NORVASC) 5 MG tablet, Take 1 tablet (5 mg total) by mouth daily. No further refills without follow-up. (Patient not taking: Reported on 03/07/2022)     Current Outpatient Medications (Other):    cholecalciferol (VITAMIN D3) 25 MCG (1000 UT) tablet, Take 1,000 Units by mouth 2 (two) times a week.    Na Sulfate-K Sulfate-Mg Sulf 17.5-3.13-1.6 GM/177ML SOLN, Take 1 kit by mouth once for 1 dose.   Omega-3 Fatty Acids (FISH OIL) 1000 MG CAPS, Take 1,000 mg by mouth 2 (two) times a week.    pantoprazole (PROTONIX) 40 MG tablet, Take 1 tablet (40 mg total) by mouth daily.   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   Multiple thyroid nodules 07/10/2017   Rheumatoid factor positive with cyclic citrullinated peptide (CCP) antibody negative 04/29/2017   Right thyroid nodule 07/10/2017   Small intestinal bacterial overgrowth (SIBO)    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 No Did it involve sudden or severe rash/hives, skin peeling, or any reaction on the inside of your mouth or nose? No Did you need to seek medical attention at a hospital or doctor's office? No When did it last happen?      Childhood If all above answers are "NO", may proceed with cephalosporin use.     Surgical History:  He  has a past surgical history that includes Knee surgery (Bilateral, [phone]); Tonsillectomy; Wisdom tooth extraction; Inguinal hernia repair (Bilateral); Vasectomy; and Hernia repair. Family History:  His family history includes Breast cancer in his mother; Diverticulitis in his mother; Heart attack in his maternal grandfather and paternal grandfather; Hypertension in his father; Leukemia in his father; Lupus in his sister; Stroke in his paternal grandfather; Thyroid disease in his cousin.  REVIEW OF SYSTEMS  : All other systems reviewed and negative except where noted in the History of Present Illness.  PHYSICAL EXAM: BP (!) 140/90   Pulse 78   Ht _0  (1.905 m)   Wt 193 lb (87.5 kg)   SpO2 98%   BMI 24.12 kg/m  General:  Pleasant, well developed male in no acute distress Head:   Normocephalic and atraumatic.  No lymphadenopathy Eyes:  sclerae anicteric,conjunctive pink  Heart:   regular rate and rhythm Pulm:  Clear anteriorly; no wheezing Abdomen:   Soft, Obese AB, Active bowel sounds. mild tenderness in the RUQ. Without guarding and Without rebound, negative Murphy no organomegaly appreciated. Rectal: Not evaluated Extremities:  Without edema. Msk: Symmetrical without gross deformities. Peripheral pulses intact.  Neurologic:  Alert and  oriented x4;  No focal deficits.  Skin:   Dry and intact without significant lesions or rashes. Psychiatric:  Cooperative. Normal mood and affect.  RELEVANT LABS AND IMAGING: CBC    Component Value Date/Time   WBC 7.3  09/15/2018 0944   RBC 5.09 09/15/2018 0944   HGB 15.5 09/15/2018 0944   HCT 46.1 09/15/2018 0944   PLT 253 09/15/2018 0944   MCV 90.6 09/15/2018 0944   MCH 30.5 09/15/2018 0944   MCHC 33.6 09/15/2018 0944   RDW 12.4 09/15/2018 0944   LYMPHSABS 2.0 07/17/2017 1109   MONOABS 0.3 07/17/2017 1109   EOSABS 0.0 07/17/2017 1109   BASOSABS 0.0 07/17/2017 1109    CMP     Component Value Date/Time   NA 142 09/15/2018 0944   K 3.9 09/15/2018 0944   CL 107 09/15/2018 0944   CO2 28 09/15/2018 0944   GLUCOSE 99 09/15/2018 0944   BUN 22 09/15/2018 0944   CREATININE 0.86 09/15/2018 0944   CALCIUM 10.1 09/15/2018 0944   PROT 6.4 09/15/2018 0944   ALBUMIN 4.1 07/17/2017 1109   AST 19 09/15/2018 0944   ALT 36 09/15/2018 0944   ALKPHOS 46 07/17/2017 1109   BILITOT 0.5 09/15/2018 0944   GFRNONAA 102 09/15/2018 0944   GFRAA 118 09/15/2018 [place] Vaniyah Lansky, PA-C 2:43 PM

## 2022-03-07 ENCOUNTER — Ambulatory Visit (INDEPENDENT_AMBULATORY_CARE_PROVIDER_SITE_OTHER): Payer: Self-pay | Admitting: Physician Assistant

## 2022-03-07 ENCOUNTER — Encounter: Payer: Self-pay | Admitting: Physician Assistant

## 2022-03-07 VITALS — BP 140/90 | HR 78 | Ht 75.0 in | Wt 273.0 lb

## 2022-03-07 DIAGNOSIS — R09A2 Foreign body sensation, throat: Secondary | ICD-10-CM

## 2022-03-07 DIAGNOSIS — R1084 Generalized abdominal pain: Secondary | ICD-10-CM

## 2022-03-07 DIAGNOSIS — K219 Gastro-esophageal reflux disease without esophagitis: Secondary | ICD-10-CM

## 2022-03-07 DIAGNOSIS — Z1211 Encounter for screening for malignant neoplasm of colon: Secondary | ICD-10-CM

## 2022-03-07 DIAGNOSIS — R1011 Right upper quadrant pain: Secondary | ICD-10-CM

## 2022-03-07 MED ORDER — PANTOPRAZOLE SODIUM 40 MG PO TBEC: 40.0000 mg | DELAYED_RELEASE_TABLET | Freq: Every day | ORAL | 3 refills | Status: DC

## 2022-03-07 MED ORDER — NA SULFATE-K SULFATE-MG SULF 17.5-3.13-1.6 GM/177ML PO SOLN: 1.0000 | Freq: Once | ORAL | 0 refills | Status: AC

## 2022-03-07 NOTE — Patient Instructions (Addendum)
Please take your proton pump inhibitor medication, Protonix '40mg'$   Please take this medication 30 minutes to 1 hour before meals- this makes it more effective.  Avoid spicy and acidic foods Avoid fatty foods Limit your intake of coffee, tea, alcohol, and carbonated drinks Work to maintain a healthy weight Keep the head of the bed elevated at least 3 inches with blocks or a wedge pillow if you are having any nighttime symptoms Stay upright for 2 hours after eating Avoid meals and snacks three to four hours before bedtime  Go to the ER if you have any yellowing of the skin/eyes, dark urine, severe AB pain, fever, chills, nausea or vomiting.    You have been scheduled for an endoscopy and colonoscopy. Please follow the written instructions given to you at your visit today. Please pick up your prep supplies at the pharmacy within the next 1-3 days. If you use inhalers (even only as needed), please bring them with you on the day of your procedure.  You will be contacted by [facility] in the next 2 days to arrange a HIDA scan.  The number on your caller ID will be [phone], please answer when they call.  If you have not heard from them in 2 days please call [phone] to schedule.      You have been scheduled for a HIDA scan at [facility] Radiology (1st floor) on _____________. Please arrive 30 minutes prior to your scheduled appointment at  _______________. Make certain not to have anything to eat or drink at least 6 hours prior to your test. Should this appointment date or time not work well for you, please call radiology scheduling at [phone].  _____________________________________________________________________ hepatobiliary (HIDA) scan is an imaging procedure used to diagnose problems in the liver, gallbladder and bile ducts. In the HIDA scan, a radioactive chemical or tracer is injected into a vein in your arm. The tracer is handled by the liver like bile. Bile  is a fluid produced and excreted by your liver that helps your digestive system break down fats in the foods you eat. Bile is stored in your gallbladder and the gallbladder releases the bile when you eat a meal. A special nuclear medicine scanner (gamma camera) tracks the flow of the tracer from your liver into your gallbladder and small intestine.  During your HIDA scan  You'll be asked to change into a hospital gown before your HIDA scan begins. Your health care team will position you on a table, usually on your back. The radioactive tracer is then injected into a vein in your arm.The tracer travels through your bloodstream to your liver, where it's taken up by the bile-producing cells. The radioactive tracer travels with the bile from your liver into your gallbladder and through your bile ducts to your small intestine.You may feel some pressure while the radioactive tracer is injected into your vein. As you lie on the table, a special gamma camera is positioned over your abdomen taking pictures of the tracer as it moves through your body. The gamma camera takes pictures continually for about an hour. You'll need to keep still during the HIDA scan. This can become uncomfortable, but you may find that you can lessen the discomfort by taking deep breaths and thinking about other things. Tell your health care team if you're uncomfortable. The radiologist will watch on a computer the progress of the radioactive tracer through your body. The HIDA scan may be stopped when the radioactive tracer is  seen in the gallbladder and enters your small intestine. This typically takes about an hour. In some cases extra imaging will be performed if original images aren't satisfactory, if morphine is given to help visualize the gallbladder or if the medication CCK is given to look at the contraction of the gallbladder. This test typically takes 2 hours to  complete. ________________________________________________________________________   I appreciate the opportunity to care for you. Vicie Mutters, PA-C

## 2022-03-26 ENCOUNTER — Ambulatory Visit: Payer: Self-pay | Admitting: Gastroenterology

## 2022-03-28 ENCOUNTER — Encounter (HOSPITAL_COMMUNITY)
Admission: RE | Admit: 2022-03-28 | Discharge: 2022-03-28 | Disposition: A | Discharge status: Home / Self Care | Payer: Self-pay | Source: Ambulatory Visit | Attending: Physician Assistant | Admitting: Physician Assistant

## 2022-03-28 ENCOUNTER — Encounter (HOSPITAL_COMMUNITY): Payer: Self-pay | Admitting: Gastroenterology

## 2022-03-28 DIAGNOSIS — R1011 Right upper quadrant pain: Secondary | ICD-10-CM | POA: Insufficient documentation

## 2022-03-28 MED ORDER — TECHNETIUM TC 99M MEBROFENIN IV KIT
5.5000 | PACK | Freq: Once | INTRAVENOUS | Status: AC
  Administered 2022-03-28: 5.5 via INTRAVENOUS

## 2022-03-28 NOTE — Progress Notes (Signed)
Attempted to obtain medical history via telephone, unable to reach at this time. HIPAA compliant voicemail message left requesting return call to pre surgical testing department.  

## 2022-04-04 ENCOUNTER — Ambulatory Visit (HOSPITAL_COMMUNITY): Payer: Self-pay | Admitting: Anesthesiology

## 2022-04-04 ENCOUNTER — Other Ambulatory Visit: Payer: Self-pay

## 2022-04-04 ENCOUNTER — Encounter (HOSPITAL_COMMUNITY)
Admission: RE | Disposition: A | Discharge status: Home / Self Care | Payer: Self-pay | Source: Home / Self Care | Attending: Gastroenterology

## 2022-04-04 ENCOUNTER — Encounter (HOSPITAL_COMMUNITY): Payer: Self-pay | Admitting: Gastroenterology

## 2022-04-04 ENCOUNTER — Ambulatory Visit (HOSPITAL_BASED_OUTPATIENT_CLINIC_OR_DEPARTMENT_OTHER): Payer: Self-pay | Admitting: Anesthesiology

## 2022-04-04 ENCOUNTER — Ambulatory Visit (HOSPITAL_COMMUNITY)
Admission: RE | Admit: 2022-04-04 | Discharge: 2022-04-04 | Disposition: A | Discharge status: Home / Self Care | Payer: Self-pay | Attending: Gastroenterology | Admitting: Gastroenterology

## 2022-04-04 DIAGNOSIS — K222 Esophageal obstruction: Secondary | ICD-10-CM | POA: Insufficient documentation

## 2022-04-04 DIAGNOSIS — K219 Gastro-esophageal reflux disease without esophagitis: Secondary | ICD-10-CM

## 2022-04-04 DIAGNOSIS — K2100 Gastro-esophageal reflux disease with esophagitis, without bleeding: Secondary | ICD-10-CM

## 2022-04-04 DIAGNOSIS — K449 Diaphragmatic hernia without obstruction or gangrene: Secondary | ICD-10-CM | POA: Insufficient documentation

## 2022-04-04 DIAGNOSIS — I10 Essential (primary) hypertension: Secondary | ICD-10-CM | POA: Insufficient documentation

## 2022-04-04 DIAGNOSIS — R1310 Dysphagia, unspecified: Secondary | ICD-10-CM | POA: Insufficient documentation

## 2022-04-04 DIAGNOSIS — R09A2 Foreign body sensation, throat: Secondary | ICD-10-CM

## 2022-04-04 DIAGNOSIS — D123 Benign neoplasm of transverse colon: Secondary | ICD-10-CM

## 2022-04-04 DIAGNOSIS — K589 Irritable bowel syndrome without diarrhea: Secondary | ICD-10-CM | POA: Insufficient documentation

## 2022-04-04 DIAGNOSIS — Z1211 Encounter for screening for malignant neoplasm of colon: Secondary | ICD-10-CM | POA: Insufficient documentation

## 2022-04-04 DIAGNOSIS — D125 Benign neoplasm of sigmoid colon: Secondary | ICD-10-CM | POA: Insufficient documentation

## 2022-04-04 DIAGNOSIS — D128 Benign neoplasm of rectum: Secondary | ICD-10-CM | POA: Insufficient documentation

## 2022-04-04 DIAGNOSIS — R1319 Other dysphagia: Secondary | ICD-10-CM

## 2022-04-04 DIAGNOSIS — R1011 Right upper quadrant pain: Secondary | ICD-10-CM

## 2022-04-04 DIAGNOSIS — K638219 Small intestinal bacterial overgrowth, unspecified: Secondary | ICD-10-CM | POA: Insufficient documentation

## 2022-04-04 DIAGNOSIS — K635 Polyp of colon: Secondary | ICD-10-CM | POA: Insufficient documentation

## 2022-04-04 DIAGNOSIS — K3189 Other diseases of stomach and duodenum: Secondary | ICD-10-CM

## 2022-04-04 DIAGNOSIS — D124 Benign neoplasm of descending colon: Secondary | ICD-10-CM

## 2022-04-04 DIAGNOSIS — R1084 Generalized abdominal pain: Secondary | ICD-10-CM

## 2022-04-04 DIAGNOSIS — D120 Benign neoplasm of cecum: Secondary | ICD-10-CM

## 2022-04-04 DIAGNOSIS — D122 Benign neoplasm of ascending colon: Secondary | ICD-10-CM | POA: Insufficient documentation

## 2022-04-04 DIAGNOSIS — M069 Rheumatoid arthritis, unspecified: Secondary | ICD-10-CM | POA: Insufficient documentation

## 2022-04-04 HISTORY — PX: ESOPHAGEAL DILATION: SHX303

## 2022-04-04 HISTORY — PX: HOT HEMOSTASIS: SHX5433

## 2022-04-04 HISTORY — PX: ESOPHAGOGASTRODUODENOSCOPY (EGD) WITH PROPOFOL: SHX5813

## 2022-04-04 HISTORY — PX: COLONOSCOPY WITH PROPOFOL: SHX5780

## 2022-04-04 HISTORY — PX: POLYPECTOMY: SHX5525

## 2022-04-04 SURGERY — COLONOSCOPY WITH PROPOFOL
Anesthesia: Monitor Anesthesia Care

## 2022-04-04 MED ORDER — PROPOFOL 10 MG/ML IV BOLUS
INTRAVENOUS | Status: DC | PRN
  Administered 2022-04-04: 20 mg via INTRAVENOUS
  Administered 2022-04-04: 80 mg via INTRAVENOUS

## 2022-04-04 MED ORDER — LACTATED RINGERS IV SOLN: INTRAVENOUS | Status: DC

## 2022-04-04 MED ORDER — SODIUM CHLORIDE 0.9 % IV SOLN: INTRAVENOUS | Status: DC

## 2022-04-04 MED ORDER — PROPOFOL 500 MG/50ML IV EMUL
INTRAVENOUS | Status: DC | PRN
  Administered 2022-04-04: 150 ug/kg/min via INTRAVENOUS

## 2022-04-04 MED ORDER — LIDOCAINE 2% (20 MG/ML) 5 ML SYRINGE
INTRAMUSCULAR | Status: DC | PRN
  Administered 2022-04-04: 100 mg via INTRAVENOUS

## 2022-04-04 SURGICAL SUPPLY — 25 items

## 2022-04-04 NOTE — Anesthesia Postprocedure Evaluation (Signed)
Anesthesia Post Note  Patient: COALTON ARCH  Procedure(s) Performed: COLONOSCOPY WITH PROPOFOL ESOPHAGOGASTRODUODENOSCOPY (EGD) WITH PROPOFOL POLYPECTOMY HOT HEMOSTASIS (ARGON PLASMA COAGULATION/BICAP) ESOPHAGEAL DILATION     Patient location during evaluation: Endoscopy Anesthesia Type: MAC Level of consciousness: awake and alert Pain management: pain level controlled Vital Signs Assessment: post-procedure vital signs reviewed and stable Respiratory status: spontaneous breathing, nonlabored ventilation and respiratory function stable Cardiovascular status: stable and blood pressure returned to baseline Postop Assessment: no apparent nausea or vomiting Anesthetic complications: no  No notable events documented.  Last Vitals:  Vitals:   04/04/22 1020 04/04/22 1025  BP: 136/80 139/84  Pulse: 87 77  Resp: 16 16  Temp:    SpO2: 96% 97%    Last Pain:  Vitals:   04/04/22 1019  TempSrc: Temporal  PainSc: 0-No pain                 Mussa Groesbeck,W. EDMOND

## 2022-04-04 NOTE — Anesthesia Preprocedure Evaluation (Addendum)
Anesthesia Evaluation  Patient identified by MRN, date of birth, ID band Patient awake    Reviewed: Allergy & Precautions, H&P , NPO status , Patient's Chart, lab work & pertinent test results  Airway Mallampati: II  TM Distance: >3 FB Neck ROM: Full    Dental no notable dental hx. (+) Teeth Intact, Dental Advisory Given   Pulmonary neg pulmonary ROS   Pulmonary exam normal breath sounds clear to auscultation       Cardiovascular hypertension, Pt. on medications  Rhythm:Regular Rate:Normal     Neuro/Psych   Anxiety Depression    negative neurological ROS     GI/Hepatic Neg liver ROS,GERD  Medicated,,  Endo/Other  negative endocrine ROS    Renal/GU negative Renal ROS  negative genitourinary   Musculoskeletal  (+) Arthritis , Osteoarthritis,    Abdominal   Peds  Hematology negative hematology ROS (+)   Anesthesia Other Findings   Reproductive/Obstetrics negative OB ROS                             Anesthesia Physical Anesthesia Plan  ASA: 2  Anesthesia Plan: MAC   Post-op Pain Management: Minimal or no pain anticipated   Induction: Intravenous  PONV Risk Score and Plan: 1 and Propofol infusion  Airway Management Planned: Natural Airway and Nasal Cannula  Additional Equipment:   Intra-op Plan:   Post-operative Plan:   Informed Consent: I have reviewed the patients History and Physical, chart, labs and discussed the procedure including the risks, benefits and alternatives for the proposed anesthesia with the patient or authorized representative who has indicated his/her understanding and acceptance.     Dental advisory given  Plan Discussed with: CRNA  Anesthesia Plan Comments:        Anesthesia Quick Evaluation

## 2022-04-04 NOTE — Op Note (Signed)
[facility] Patient Name: Eugene Coleman Procedure Date: 04/04/2022 MRN: [id number] Attending MD: Ladene Artist , MD, [id number] Date of Birth: 06-01-1970 CSN: [id number] Age: 52 Admit Type: Outpatient Procedure:                Upper GI endoscopy Indications:              Dysphagia, Gastroesophageal reflux disease, Globus                            sensation Providers:                Pricilla Riffle. Fuller Plan, MD, Mikey [employer], RN, Cletis Athens, Technician Referring MD:             Pricilla Riffle. Fuller Plan, MD Medicines:                 Complications:            No immediate complications. Estimated Blood Loss:     Estimated blood loss was minimal. Procedure:                Pre-Anesthesia Assessment:                           - Prior to the procedure, a History and Physical                            was performed, and patient medications and                            allergies were reviewed. The patient's tolerance of                            previous anesthesia was also reviewed. The risks                            and benefits of the procedure and the sedation                            options and risks were discussed with the patient.                            All questions were answered, and informed consent                            was obtained. Prior Anticoagulants: The patient has                            taken no anticoagulant or antiplatelet agents. ASA                            Grade Assessment: II - A patient with mild systemic  disease. After reviewing the risks and benefits,                            the patient was deemed in satisfactory condition to                            undergo the procedure.                           - Prior to the procedure, a History and Physical                            was performed, and patient medications and                            allergies were reviewed. The  patient's tolerance of                            previous anesthesia was also reviewed. The risks                            and benefits of the procedure and the sedation                            options and risks were discussed with the patient.                            All questions were answered, and informed consent                            was obtained. Prior Anticoagulants: The patient has                            taken no anticoagulant or antiplatelet agents. ASA                            Grade Assessment: II - A patient with mild systemic                            disease. After reviewing the risks and benefits,                            the patient was deemed in satisfactory condition to                            undergo the procedure.                           After obtaining informed consent, the endoscope was                            passed under direct vision. Throughout the  procedure, the patient's blood pressure, pulse, and                            oxygen saturations were monitored continuously. The                            [id number]-H190 ([id number]) Olympus endoscope was introduced                            through the mouth, and advanced to the second part                            of duodenum. The upper GI endoscopy was                            accomplished without difficulty. The patient                            tolerated the procedure well. Scope In: Scope Out: Findings:      LA Grade A (one or more mucosal breaks less than 5 mm, not extending       between tops of 2 mucosal folds) esophagitis with no bleeding was found       in the distal esophagus.      One benign-appearing, intrinsic mild stenosis was found at the       gastroesophageal junction. This stenosis measured 1.4 cm (inner       diameter) x less than one cm (in length). The stenosis was traversed. A       guidewire was placed and the scope was withdrawn.  Dilation was performed       with a Savary dilator with no resistance at 17 mm. No heme noted.      The exam of the esophagus was otherwise normal.      A medium-sized hiatal hernia was present.      The exam of the stomach was otherwise normal.      Patchy mildly erythematous mucosa without active bleeding and with no       stigmata of bleeding was found in the duodenal bulb.      The exam of the duodenum was otherwise normal. Impression:               - LA Grade A reflux esophagitis with no bleeding.                           - Benign-appearing esophageal stenosis. Dilated.                           - Medium-sized hiatal hernia.                           - Erythematous duodenopathy.                           - No specimens collected. Moderate Sedation:      Not Applicable - Patient had care per Anesthesia. Recommendation:           - Patient has a contact number available for  emergencies. The signs and symptoms of potential                            delayed complications were discussed with the                            patient. Return to normal activities tomorrow.                            Written discharge instructions were provided to the                            patient.                           - Clear liquids for 2 hours then soft diet today.                           - Resume previous diet tomorrow.                           - Antireflux measures long term.                           - Continue present medications.                           - Return to GI office in 1 year. Procedure Code(s):        --- Professional ---                           [phone], Esophagogastroduodenoscopy, flexible,                            transoral; with insertion of guide wire followed by                            passage of dilator(s) through esophagus over guide                            wire Diagnosis Code(s):        --- Professional ---                            K21.00, Gastro-esophageal reflux disease with                            esophagitis, without bleeding                           K22.2, Esophageal obstruction                           K44.9, Diaphragmatic hernia without obstruction or                            gangrene  K31.89, Other diseases of stomach and duodenum                           R13.10, Dysphagia, unspecified                           F45.8, Other somatoform disorders CPT copyright 2022 [employer]. All rights reserved. The codes documented in this report are preliminary and upon coder review may  be revised to meet current compliance requirements. Ladene Artist, MD 04/04/2022 10:21:46 AM This report has been signed electronically. Number of Addenda: 0

## 2022-04-04 NOTE — Discharge Instructions (Signed)
YOU HAD AN ENDOSCOPIC PROCEDURE TODAY: Refer to the procedure report and other information in the discharge instructions given to you for any specific questions about what was found during the examination. If this information does not answer your questions, please call [place] office at [phone] to clarify.  ° °YOU SHOULD EXPECT: Some feelings of bloating in the abdomen. Passage of more gas than usual. Walking can help get rid of the air that was put into your GI tract during the procedure and reduce the bloating. If you had a lower endoscopy (such as a colonoscopy or flexible sigmoidoscopy) you may notice spotting of blood in your stool or on the toilet paper. Some abdominal soreness may be present for a day or two, also. ° °DIET: Your first meal following the procedure should be a light meal and then it is ok to progress to your normal diet. A half-sandwich or bowl of soup is an example of a good first meal. Heavy or fried foods are harder to digest and may make you feel nauseous or bloated. Drink plenty of fluids but you should avoid alcoholic beverages for 24 hours. If you had a esophageal dilation, please see attached instructions for diet.   ° °ACTIVITY: Your care partner should take you home directly after the procedure. You should plan to take it easy, moving slowly for the rest of the day. You can resume normal activity the day after the procedure however YOU SHOULD NOT DRIVE, use power tools, machinery or perform tasks that involve climbing or major physical exertion for 24 hours (because of the sedation medicines used during the test).  ° °SYMPTOMS TO REPORT IMMEDIATELY: °A gastroenterologist can be reached at any hour. Please call [phone]  for any of the following symptoms:  °Following lower endoscopy (colonoscopy, flexible sigmoidoscopy) °Excessive amounts of blood in the stool  °Significant tenderness, worsening of abdominal pains  °Swelling of the abdomen that is new, acute  °Fever of 100° or  higher  °Following upper endoscopy (EGD, EUS, ERCP, esophageal dilation) °Vomiting of blood or coffee ground material  °New, significant abdominal pain  °New, significant chest pain or pain under the shoulder blades  °Painful or persistently difficult swallowing  °New shortness of breath  °Black, tarry-looking or red, bloody stools ° °FOLLOW UP:  °If any biopsies were taken you will be contacted by phone or by letter within the next 1-3 weeks. Call [phone]  if you have not heard about the biopsies in 3 weeks.  °Please also call with any specific questions about appointments or follow up tests. ° °

## 2022-04-04 NOTE — Interval H&P Note (Signed)
History and Physical Interval Note:  04/04/2022 9:22 AM  Eugene Coleman  has presented today for surgery, with the diagnosis of RUQ pain, abdominal pain, GERD, colon cancer screening.  The various methods of treatment have been discussed with the patient and family. After consideration of risks, benefits and other options for treatment, the patient has consented to  Procedure(s): COLONOSCOPY WITH PROPOFOL (N/A) ESOPHAGOGASTRODUODENOSCOPY (EGD) WITH PROPOFOL (N/A) as a surgical intervention.  The patient's history has been reviewed, patient examined, no change in status, stable for surgery.  I have reviewed the patient's chart and labs.  Questions were answered to the patient's satisfaction.     Pricilla Riffle. Fuller Plan

## 2022-04-04 NOTE — Transfer of Care (Signed)
Immediate Anesthesia Transfer of Care Note  Patient: Eugene Coleman  Procedure(s) Performed: COLONOSCOPY WITH PROPOFOL ESOPHAGOGASTRODUODENOSCOPY (EGD) WITH PROPOFOL POLYPECTOMY HOT HEMOSTASIS (ARGON PLASMA COAGULATION/BICAP) ESOPHAGEAL DILATION  Patient Location: Endoscopy Unit  Anesthesia Type:MAC  Level of Consciousness: awake, alert , oriented, patient cooperative, and responds to stimulation  Airway & Oxygen Therapy: Patient Spontanous Breathing and Patient connected to face mask oxygen  Post-op Assessment: Report given to RN, Post -op Vital signs reviewed and stable, and Patient moving all extremities X 4  Post vital signs: Reviewed and stable  Last Vitals:  Vitals Value Taken Time  BP 136/80 04/04/22 1020  Temp    Pulse 83 04/04/22 1021  Resp 20 04/04/22 1021  SpO2 96 % 04/04/22 1021  Vitals shown include unvalidated device data.  Last Pain:  Vitals:   04/04/22 0823  TempSrc: Temporal  PainSc: 5       Patients Stated Pain Goal: 5 (01/49/96 9249)  Complications: No notable events documented.

## 2022-04-04 NOTE — Op Note (Signed)
[facility] Patient Name: Eugene Coleman Procedure Date: 04/04/2022 MRN: [id number] Attending MD: Ladene Artist , MD, [id number] Date of Birth: 1971-01-03 CSN: [id number] Age: 52 Admit Type: Outpatient Procedure:                Colonoscopy Indications:              Screening for colorectal malignant neoplasm Providers:                Pricilla Riffle. Fuller Plan, MD, Mikey [employer], RN, Cletis Athens, Technician Referring MD:             Pricilla Riffle. Fuller Plan, MD Medicines:                Monitored Anesthesia Care Complications:            No immediate complications. Estimated blood loss:                            None. Estimated Blood Loss:     Estimated blood loss: none. Procedure:                Pre-Anesthesia Assessment:                           - Prior to the procedure, a History and Physical                            was performed, and patient medications and                            allergies were reviewed. The patient's tolerance of                            previous anesthesia was also reviewed. The risks                            and benefits of the procedure and the sedation                            options and risks were discussed with the patient.                            All questions were answered, and informed consent                            was obtained. Prior Anticoagulants: The patient has                            taken no anticoagulant or antiplatelet agents. ASA                            Grade Assessment: II - A patient with mild systemic  disease. After reviewing the risks and benefits,                            the patient was deemed in satisfactory condition to                            undergo the procedure.                           After obtaining informed consent, the colonoscope                            was passed under direct vision. Throughout the                             procedure, the patient's blood pressure, pulse, and                            oxygen saturations were monitored continuously. The                            [id number]-HQ190L ([id number]) Olympus colonoscope was                            introduced through the anus and advanced to the the                            cecum, identified by appendiceal orifice and                            ileocecal valve. The ileocecal valve, appendiceal                            orifice, and rectum were photographed. The quality                            of the bowel preparation was good. The colonoscopy                            was performed without difficulty. The patient                            tolerated the procedure well. Scope In: 9:38:50 AM Scope Out: 10:00:38 AM Scope Withdrawal Time: 0 hours 18 minutes 55 seconds  Total Procedure Duration: 0 hours 21 minutes 48 seconds  Findings:      The perianal and digital rectal examinations were normal.      Six sessile polyps were found in the rectum, sigmoid colon, transverse       colon (2), ascending colon (1) and cecum. The polyps were 6 to 8 mm in       size. These polyps were removed with a cold snare. Resection and       retrieval were complete.      A 3 mm polyp was found in the ascending colon. The polyp was sessile.  The polyp was removed with a cold biopsy forceps. Resection and       retrieval were complete.      A 15 mm polyp was found in the descending colon. The polyp was       pedunculated. The polyp was removed with a hot snare. Resection and       retrieval were complete.      The exam was otherwise without abnormality on direct and retroflexion       views. Impression:               - One 15 mm polyp in the descending colon, removed                            with a hot snare. Resected and retrieved.                           - One 3 mm polyp in the ascending colon removed                            with cold foceps. Resected and  retrieved.                           - Six 6 to 8 mm polyps in the rectum, in the                            sigmoid colon, in the transverse colon, in the                            ascending colon and in the cecum, removed with a                            cold snare. Resected and retrieved.                           - The examination was otherwise normal on direct                            and retroflexion views. Moderate Sedation:      Not Applicable - Patient had care per Anesthesia. Recommendation:           - Repeat colonoscopy, likely 3 years, after studies                            are complete for surveillance based on pathology                            results.                           - Patient has a contact number available for                            emergencies. The signs and symptoms of potential  delayed complications were discussed with the                            patient. Return to normal activities tomorrow.                            Written discharge instructions were provided to the                            patient.                           - Resume previous diet.                           - Continue present medications.                           - Await pathology results.                           - No aspirin, ibuprofen, naproxen, or other                            non-steroidal anti-inflammatory drugs for 2 weeks                            after polyp removal. Procedure Code(s):        --- Professional ---                           [phone], Colonoscopy, flexible; with removal of                            tumor(s), polyp(s), or other lesion(s) by snare                            technique Diagnosis Code(s):        --- Professional ---                           Z12.11, Encounter for screening for malignant                            neoplasm of colon                           D12.4, Benign neoplasm of descending colon                            D12.8, Benign neoplasm of rectum                           D12.5, Benign neoplasm of sigmoid colon                           D12.3, Benign neoplasm of transverse colon (hepatic  flexure or splenic flexure)                           D12.2, Benign neoplasm of ascending colon                           D12.0, Benign neoplasm of cecum CPT copyright 2022 American Medical Association. All rights reserved. The codes documented in this report are preliminary and upon coder review may  be revised to meet current compliance requirements. Ladene Artist, MD 04/04/2022 10:16:24 AM This report has been signed electronically. Number of Addenda: 0

## 2022-04-05 ENCOUNTER — Telehealth (HOSPITAL_COMMUNITY): Payer: Self-pay

## 2022-04-05 ENCOUNTER — Encounter (HOSPITAL_COMMUNITY): Payer: Self-pay | Admitting: Gastroenterology

## 2022-04-05 LAB — SURGICAL PATHOLOGY

## 2022-04-05 NOTE — Telephone Encounter (Signed)
Pt called today concerned with some dark red blood in stool when he goes to the bathroom.  Pt had colonoscopy yesterday with Dr Fuller Plan with 8 polyps removed.  Informed pt this is probably normal after several polyps removed but that I would notify his physician.  Pt also has questions about his RUQ pain, which he feels was not addressed with procedure.

## 2022-04-05 NOTE — Telephone Encounter (Signed)
The pt had polypectomy yesterday and developed dark almost maroon color stool this morning. He sees this only when he has a BM (soft stools) 3 episodes since procedure.  He has some discomfort in the upper abd.  He states that the discomfort is the same he has had for the past 6 months.  He does state that he is not really concerned with the bleeding but thought he should mention it. I did advise that if he develops worse bleeding or he has it independent of stool with worse abd discomfort/pain he should call the on call MD or go to the ED/UC for evaluation over the weekend.   FYI Dr Fuller Plan

## 2022-04-05 NOTE — Telephone Encounter (Signed)
Agree. Rest at home this evening. No strenuous activities for the next few days. If bleeding persists or worsens he needs go to the closest ED.

## 2022-04-10 ENCOUNTER — Encounter: Payer: Self-pay | Admitting: Gastroenterology

## 2022-06-02 ENCOUNTER — Other Ambulatory Visit: Payer: Self-pay | Admitting: Physician Assistant

## 2022-08-23 ENCOUNTER — Encounter: Payer: Self-pay | Admitting: Family Medicine

## 2022-08-23 ENCOUNTER — Ambulatory Visit: Payer: 59 | Admitting: Family Medicine

## 2022-08-23 VITALS — BP 138/84 | HR 77 | Temp 99.0°F | Ht 75.0 in | Wt 275.1 lb

## 2022-08-23 DIAGNOSIS — I10 Essential (primary) hypertension: Secondary | ICD-10-CM | POA: Diagnosis not present

## 2022-08-23 DIAGNOSIS — M545 Low back pain, unspecified: Secondary | ICD-10-CM

## 2022-08-23 DIAGNOSIS — R1011 Right upper quadrant pain: Secondary | ICD-10-CM

## 2022-08-23 DIAGNOSIS — R208 Other disturbances of skin sensation: Secondary | ICD-10-CM

## 2022-08-23 DIAGNOSIS — Z1389 Encounter for screening for other disorder: Secondary | ICD-10-CM

## 2022-08-23 DIAGNOSIS — Z7689 Persons encountering health services in other specified circumstances: Secondary | ICD-10-CM

## 2022-08-23 LAB — POCT URINALYSIS DIPSTICK
Bilirubin, UA: NEGATIVE
Blood, UA: NEGATIVE
Glucose, UA: NEGATIVE
Ketones, UA: NEGATIVE
Leukocytes, UA: NEGATIVE
Nitrite, UA: NEGATIVE
Protein, UA: NEGATIVE
Spec Grav, UA: >=1.03 — AB (ref 1.010–1.025)
Urobilinogen, UA: 0.2 E.U./dL
pH, UA: 6 (ref 5.0–8.0)

## 2022-08-23 MED ORDER — GABAPENTIN 100 MG PO CAPS: 100.0000 mg | ORAL_CAPSULE | Freq: Every day | ORAL | 0 refills | Status: DC

## 2022-08-23 NOTE — Assessment & Plan Note (Signed)
Blood pressure stable today. Continue with Amlodipine 10mg  daily and HCTZ 25mg . Ordered CMP to assess kidney function and potassium level.

## 2022-08-23 NOTE — Patient Instructions (Signed)
-  It was a pleasure to meet you and look forward to taking care of you. -Ordered labs for reason of symptoms. Office will call with lab results and you may see results on MyChart.  -Declined a scan for abd pain today. If symptoms become worse, follow up to the closes emergency department. -Prescribed Doxycyline 100mg  tablet, 1 tablet at night time.  -Follow up in 2 weeks.

## 2022-08-23 NOTE — Progress Notes (Addendum)
New Patient Office Visit  Subjective    Patient ID: Eugene Coleman, male    DOB: 1970/12/17  Age: 52 y.o. MRN: [id number]  CC:  Chief Complaint  Patient presents with   Establish Care    Pt is here today to Est.Care Pt reports he has hx of Gal bladder issues since 30s Pt reports he thinks he is having issues now, upper and lower back pain and some pain in lower abdominal. Pt reports he has noticed some burning in feet laying down    HPI Eugene Coleman presents to establish care with new provider.   Patients previous primary care provider was Pearletha Alfred, PA with [facility]. Last seen 02/28/2022.  Specialist: [facility] with Dr. Karolee Ohs   HTN: Chronic. Patient takes Amlodipine 10mg  daily and HCTZ 25mg  daily. Patent only monitors his blood pressure when he has symptoms. Not monitored recently. Denies current CP, SHOB, HA, dizziness, or lightheadedness, or lower extremity edema.   Patient is complaining of right upper abd pain that radiates into back. He reports pain started about 1-2 weeks ago, got worse yesterday. Described as constant dull pain with changes of sharp pain intermittent. Pain became worse after drinking coffee. Reports nausea, but no vomiting. Denies any fever. Initially, he complained of lower back pain to CMA. He reports he has had episodes of this abd pain in the past with scans that really didn't show anything. He reports he knows he has gallstones.   Patient is complaining of constant feet burning, mostly at night. Started 6 years ago, but increased about 2 months ago. Also, complains of a little in hands.   He is questioning if these symptoms could be related to autoimmune disorder. Based on chart review, in 04/2017, he had a positive RA factor, but denies every see a rheumatologist.  Outpatient Encounter Medications as of 08/23/2022  Medication Sig   amLODipine (NORVASC) 10 MG  tablet Take 10 mg by mouth daily.   gabapentin (NEURONTIN) 100 MG capsule Take 1 capsule (100 mg total) by mouth at bedtime.   hydrochlorothiazide (HYDRODIURIL) 25 MG tablet Take 25 mg by mouth daily.   Menaquinone-7 (VITAMIN K2) 40 MCG TABS Take by mouth.   Multiple Vitamins-Minerals (MULTIVITAMIN WITH MINERALS) tablet Take 1 tablet by mouth daily.   Omega-3 Fatty Acids (FISH OIL) 1000 MG CAPS Take 1,000 mg by mouth 2 (two) times a week.    pantoprazole (PROTONIX) 40 MG tablet TAKE 1 TABLET BY MOUTH EVERY DAY   [DISCONTINUED] amLODipine (NORVASC) 5 MG tablet Take 1 tablet (5 mg total) by mouth daily. No further refills without follow-up. (Patient not taking: Reported on 03/07/2022)   [DISCONTINUED] cholecalciferol (VITAMIN D3) 25 MCG (1000 UT) tablet Take 1,000 Units by mouth 2 (two) times a week.  (Patient not taking: Reported on 08/23/2022)   [DISCONTINUED] sucralfate (CARAFATE) 1 GM/10ML suspension TAKE 10 ML 2 TIMES PER DAY FOR 14 DAYS (Patient not taking: Reported on 08/23/2022)   No facility-administered encounter medications on file as of 08/23/2022.    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/2012  Multiple thyroid nodules 07/10/2017   Rheumatoid factor positive with cyclic citrullinated peptide (CCP) antibody negative 04/29/2017   Right thyroid nodule 07/10/2017   Small intestinal bacterial overgrowth (SIBO)    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    Past Surgical History:  Procedure Laterality Date   COLONOSCOPY WITH PROPOFOL N/A 04/04/2022   Procedure: COLONOSCOPY WITH PROPOFOL;  Surgeon: Meryl Dare, MD;  Location: [facility];  Service:  Gastroenterology;  Laterality: N/A;   ESOPHAGEAL DILATION  04/04/2022   Procedure: ESOPHAGEAL DILATION;  Surgeon: Meryl Dare, MD;  Location: [facility];  Service: Gastroenterology;;   ESOPHAGOGASTRODUODENOSCOPY (EGD) WITH PROPOFOL N/A 04/04/2022   Procedure: ESOPHAGOGASTRODUODENOSCOPY (EGD) WITH PROPOFOL;  Surgeon: Meryl Dare, MD;  Location: [facility];  Service: Gastroenterology;  Laterality: N/A;   HERNIA REPAIR     HOT HEMOSTASIS N/A 04/04/2022   Procedure: HOT HEMOSTASIS (ARGON PLASMA COAGULATION/BICAP);  Surgeon: Meryl Dare, MD;  Location: [facility];  Service: Gastroenterology;  Laterality: N/A;   INGUINAL HERNIA REPAIR Bilateral    with Vasectomy    KNEE SURGERY Bilateral [phone]   left x 2, right x 1   POLYPECTOMY  04/04/2022   Procedure: POLYPECTOMY;  Surgeon: Meryl Dare, MD;  Location: [facility];  Service: Gastroenterology;;   TONSILLECTOMY     VASECTOMY     WISDOM TOOTH EXTRACTION     childhood    Family History  Problem Relation Age of Onset   Breast cancer Mother    Diverticulitis Mother    Leukemia Father    Hypertension Father    Lupus Sister    Thyroid disease Brother        Part of thyroid has been removed   Heart attack Maternal Grandfather    Thyroid disease Paternal Grandmother    Heart attack Paternal Grandfather    Stroke Paternal Grandfather    Colon cancer Neg Hx    Colon polyps Neg Hx    Esophageal cancer Neg Hx    Rectal cancer Neg Hx     Social History   Socioeconomic History   Marital status: Married    Spouse name: Not on file   Number of children: 7   Years of education: Not on file   Highest education level: Associate degree: academic program  Occupational History   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  Tobacco Use   Smoking status: Never   Smokeless tobacco: Former    Quit date: 01/08/1999  Vaping Use   Vaping Use: Never used  Substance and Sexual Activity   Alcohol use: No   Drug use: No   Sexual  activity: Yes  Other Topics Concern   Not on file  Social History Narrative   Not on file   Social Determinants of Health   Financial Resource Strain: Not on file  Food Insecurity: No Food Insecurity (08/23/2022)   Hunger Vital Sign    Worried About Running Out of Food in the Last Year: Never true    Ran Out of Food in the Last Year: Never true  Transportation Needs: No Transportation Needs (08/23/2022)   PRAPARE - Administrator, Civil Service (Medical): No    Lack of Transportation (Non-Medical): No  Physical Activity: Sufficiently Active (08/23/2022)   Exercise Vital Sign    Days of Exercise per Week: 3 days    Minutes of Exercise per Session: 60 min  Stress: No Stress Concern Present (08/23/2022)   [employer] of Occupational Health -  Occupational Stress Questionnaire    Feeling of Stress : Only a little  Social Connections: Socially Integrated (08/23/2022)   Social Connection and Isolation Panel [NHANES]    Frequency of Communication with Friends and Family: More than three times a week    Frequency of Social Gatherings with Friends and Family: Once a week    Attends Religious Services: More than 4 times per year    Active Member of [employer] or Organizations: Yes    Attends Engineer, structural: More than 4 times per year    Marital Status: Married  Catering manager Violence: Not At Risk (08/23/2022)   Humiliation, Afraid, Rape, and Kick questionnaire    Fear of Current or Ex-Partner: No    Emotionally Abused: No    Physically Abused: No    Sexually Abused: No    ROS See HPI above    Objective   BP 138/84   Pulse 77   Temp 99 F (37.2 C)   Ht 6\' 3"  (1.905 m)   Wt 275 lb 2 oz (124.8 kg)   SpO2 99%   BMI 34.39 kg/m   Physical Exam Vitals reviewed.  Constitutional:      General: He is not in acute distress.    Appearance: Normal appearance. He is obese. He is not ill-appearing, toxic-appearing or diaphoretic.  HENT:     Head:  Normocephalic and atraumatic.  Eyes:     General:        Right eye: No discharge.        Left eye: No discharge.     Conjunctiva/sclera: Conjunctivae normal.  Cardiovascular:     Rate and Rhythm: Normal rate and regular rhythm.     Heart sounds: Normal heart sounds. No murmur heard.    No friction rub. No gallop.  Pulmonary:     Effort: Pulmonary effort is normal. No respiratory distress.     Breath sounds: Normal breath sounds.  Abdominal:     General: Bowel sounds are normal. There is distension.     Palpations: Abdomen is soft. There is no mass.     Tenderness: There is abdominal tenderness (right mid upper quad). There is no right CVA tenderness, left CVA tenderness or guarding.  Musculoskeletal:        General: Normal range of motion.     Right lower leg: No edema.     Left lower leg: No edema.  Skin:    General: Skin is warm and dry.  Neurological:     General: No focal deficit present.     Mental Status: He is alert and oriented to person, place, and time. Mental status is at baseline.  Psychiatric:        Mood and Affect: Mood normal.        Behavior: Behavior normal.        Thought Content: Thought content normal.        Judgment: Judgment normal.     Latest Reference Range & Units Most Recent  Bilirubin, UA  NEG 08/23/22 13:18  Glucose Negative  Negative 08/23/22 13:18  Ketones, UA  NEG 08/23/22 13:18  Leukocytes,UA Negative  Negative 08/23/22 13:18  Nitrite, UA  NEG 08/23/22 13:18  pH, UA 5.0 - 8.0  6.0 08/23/22 13:18  Protein,UA Negative  Negative 08/23/22 13:18  Specific Gravity, UA 1.010 - 1.025  >=1.030 ! 08/23/22 13:18  Urobilinogen, UA 0.2 or 1.0 E.U./dL 0.2 06/18/79 19:14  RBC, UA  NEG 08/23/22 13:18  !: Data is  abnormal   Assessment & Plan:  Right upper quadrant abdominal pain -     CBC with Differential/Platelet -     Comprehensive metabolic panel -     Lipase  Hypertension goal BP (blood pressure) < 130/80 Assessment & Plan: Blood pressure  stable today. Continue with Amlodipine 10mg  daily and HCTZ 25mg . Ordered CMP to assess kidney function and potassium level.   Orders: -     Comprehensive metabolic panel  Acute bilateral low back pain without sciatica -     POCT urinalysis dipstick  Burning sensation of lower extremity -     Gabapentin; Take 1 capsule (100 mg total) by mouth at bedtime.  Dispense: 30 capsule; Refill: 0  Screening for rheumatic disorder -     Rheumatoid factor -     Sedimentation rate  Encounter to establish care -     Hepatitis C antibody  1.Review health maintenance: -Covid vaccine and booster:declined -Ordered Hep C screening -Tdap:declined -Shingrix vaccine: declined 2.Patient is complaining of right upper quad pain with concerns of his gallbladder. Offered to do a scan for symptoms, but patient declined. [place]. Advised patient if his symptoms become worse, he needed to follow up with the closes emergency department. In the meantime, ordered CBC, CMP, and lipase for his symptoms.  2.Initially, complained of lower back pain to CMA. Urinalysis completed, negative for any signs of infection.  3.Prescribed Gabapentin 100mg  tablet, 1 tablet at bedtime to see if this helps with his complaints of burning mostly in his feet and some in his hands.  4.Ordered RA and sed rate for previous positive RA factor. If it is positive again, recommend a referral to rheumatology.   Return in about 2 weeks (around 09/06/2022) for chronic management.   Zandra Abts, NP

## 2022-08-26 ENCOUNTER — Encounter: Payer: Self-pay | Admitting: Family Medicine

## 2022-08-26 DIAGNOSIS — R208 Other disturbances of skin sensation: Secondary | ICD-10-CM

## 2022-08-26 LAB — COMPREHENSIVE METABOLIC PANEL
AG Ratio: 1.7 (calc) (ref 1.0–2.5)
ALT: 38 U/L (ref 9–46)
AST: 22 U/L (ref 10–35)
Albumin: 4.5 g/dL (ref 3.6–5.1)
Alkaline phosphatase (APISO): 58 U/L (ref 35–144)
BUN: 19 mg/dL (ref 7–25)
CO2: 25 mmol/L (ref 20–32)
Calcium: 9.8 mg/dL (ref 8.6–10.3)
Chloride: 106 mmol/L (ref 98–110)
Creat: 0.96 mg/dL (ref 0.70–1.30)
Globulin: 2.7 g/dL (calc) (ref 1.9–3.7)
Glucose, Bld: 95 mg/dL (ref 65–99)
Potassium: 3.9 mmol/L (ref 3.5–5.3)
Sodium: 141 mmol/L (ref 135–146)
Total Bilirubin: 0.8 mg/dL (ref 0.2–1.2)
Total Protein: 7.2 g/dL (ref 6.1–8.1)

## 2022-08-26 LAB — CBC WITH DIFFERENTIAL/PLATELET
Absolute Monocytes: 498 cells/uL (ref 200–950)
Basophils Absolute: 38 cells/uL (ref 0–200)
Basophils Relative: 0.4 %
Eosinophils Absolute: 85 cells/uL (ref 15–500)
Eosinophils Relative: 0.9 %
HCT: 48 % (ref 38.5–50.0)
Hemoglobin: 15.9 g/dL (ref 13.2–17.1)
Lymphs Abs: 2933 cells/uL (ref 850–3900)
MCH: 29.9 pg (ref 27.0–33.0)
MCHC: 33.1 g/dL (ref 32.0–36.0)
MCV: 90.4 fL (ref 80.0–100.0)
MPV: 11.4 fL (ref 7.5–12.5)
Monocytes Relative: 5.3 %
Neutro Abs: 5847 cells/uL (ref 1500–7800)
Neutrophils Relative %: 62.2 %
Platelets: 246 10*3/uL (ref 140–400)
RBC: 5.31 10*6/uL (ref 4.20–5.80)
RDW: 12.5 % (ref 11.0–15.0)
Total Lymphocyte: 31.2 %
WBC: 9.4 10*3/uL (ref 3.8–10.8)

## 2022-08-26 LAB — LIPASE: Lipase: 26 U/L (ref 7–60)

## 2022-08-26 LAB — HEPATITIS C ANTIBODY: Hepatitis C Ab: NONREACTIVE

## 2022-08-26 LAB — SEDIMENTATION RATE: Sed Rate: 2 mm/h (ref 0–20)

## 2022-08-26 LAB — RHEUMATOID FACTOR: Rheumatoid fact SerPl-aCnc: 21 IU/mL — ABNORMAL HIGH (ref <14)

## 2022-08-26 MED ORDER — GABAPENTIN 100 MG PO CAPS: 100.0000 mg | ORAL_CAPSULE | Freq: Every day | ORAL | 0 refills | Status: DC

## 2022-08-27 ENCOUNTER — Other Ambulatory Visit: Payer: Self-pay

## 2022-08-27 DIAGNOSIS — R768 Other specified abnormal immunological findings in serum: Secondary | ICD-10-CM

## 2022-08-27 NOTE — Progress Notes (Signed)
Referral was place

## 2022-08-27 NOTE — Progress Notes (Signed)
Referral was placed 

## 2022-09-05 NOTE — Progress Notes (Signed)
Established Patient Office Visit   Subjective:  Patient ID: Eugene Coleman, male    DOB: 06/13/1970  Age: 52 y.o. MRN: [id number]  Chief Complaint  Patient presents with   Leg Pain    Pt is here today w/C/O burning in both legs and feet. Pt reports he has not taking the Gabapentin-He is going to loose weight and exercise. Pt has is requesting refill on Amlodipine    HPI Burning sensation of lower extremity: On previous appointment, patient was prescribed Gabapentin 100mg  at bedtime. He reports he decided to not take the medication. He reports he is concerned about the side effects and does not want to take medication. He reports he picked up the medication and will only take it has needed. He reports the same time he started to notice the burning in both his legs and feet, he started gaining weight, 40Ibs, increased Protonix, and increased the HCTZ.   On previous lab, patients RA factor came back positive. He has an appointment scheduled with Dr. Sheliah Hatch with rheumatologist.   HTN: Chronic. Patient takes Amlodipine 10mg  daily and HCTZ 25mg  daily. Patient reports he needs a refill on blood pressure medication. He does not monitor his blood pressure at home, except if he has symptoms. He reports he has not missed any doses of medication. Denies chest pain, SHOB, HA, dizziness, or lightheadedness.  BP Readings from Last 3 Encounters:  09/09/22 (!) 122/94  08/23/22 138/84  04/04/22 139/84    ROS See HPI above    Objective:   BP (!) 122/94   Pulse 96   Temp 98.5 F (36.9 C)   Ht 6\' 3"  (1.905 m)   Wt 279 lb (126.6 kg)   SpO2 98%   BMI 34.87 kg/m    Physical Exam Vitals reviewed.  Constitutional:      General: He is not in acute distress.    Appearance: Normal appearance. He is obese. He is not ill-appearing, toxic-appearing or diaphoretic.  Eyes:     General:        Right eye: No discharge.        Left eye: No discharge.     Conjunctiva/sclera: Conjunctivae  normal.  Cardiovascular:     Rate and Rhythm: Normal rate and regular rhythm.     Heart sounds: Normal heart sounds. No murmur heard.    No friction rub. No gallop.  Pulmonary:     Effort: Pulmonary effort is normal. No respiratory distress.     Breath sounds: Normal breath sounds.  Musculoskeletal:        General: Normal range of motion.  Skin:    General: Skin is warm and dry.  Neurological:     General: No focal deficit present.     Mental Status: He is alert and oriented to person, place, and time. Mental status is at baseline.  Psychiatric:        Mood and Affect: Mood normal.        Behavior: Behavior normal.        Thought Content: Thought content normal.        Judgment: Judgment normal.      Assessment & Plan:  Hypertension goal BP (blood pressure) < 130/80 Assessment & Plan: Diasystolic is slightly elevated today, but last two blood pressures have been stable. Refilled Amlodipine 10mg  HCTZ 25mg  daily. CMP was completed on previous visit with normal results.  Orders: -     amLODIPine Besylate; Take 1 tablet (10 mg total) by  mouth daily.  Dispense: 90 tablet; Refill: 1 -     hydroCHLOROthiazide; Take 1 tablet (25 mg total) by mouth daily.  Dispense: 90 tablet; Refill: 1  Burning sensation of lower extremity  Obesity (BMI 30.0-34.9)  1.Discontinued Gabapentin since patient reports he does not want to take medication. He would like to try to loose weight before going on any medication. He reports he has been on a diet for the last two days and going to work on weight loss. Encouraged patient to continue with diet and exercise for weight loss. 2.Follow up if symptoms become worse with numbness and tingling.   Return in about 9 months (around 06/10/2023) for follow-up.   Zandra Abts, NP

## 2022-09-09 ENCOUNTER — Ambulatory Visit (INDEPENDENT_AMBULATORY_CARE_PROVIDER_SITE_OTHER): Payer: 59 | Admitting: Family Medicine

## 2022-09-09 ENCOUNTER — Encounter: Payer: Self-pay | Admitting: Family Medicine

## 2022-09-09 VITALS — BP 122/94 | HR 96 | Temp 98.5°F | Ht 75.0 in | Wt 279.0 lb

## 2022-09-09 DIAGNOSIS — E669 Obesity, unspecified: Secondary | ICD-10-CM

## 2022-09-09 DIAGNOSIS — Z6830 Body mass index (BMI) 30.0-30.9, adult: Secondary | ICD-10-CM | POA: Diagnosis not present

## 2022-09-09 DIAGNOSIS — I10 Essential (primary) hypertension: Secondary | ICD-10-CM

## 2022-09-09 DIAGNOSIS — R208 Other disturbances of skin sensation: Secondary | ICD-10-CM | POA: Diagnosis not present

## 2022-09-09 MED ORDER — HYDROCHLOROTHIAZIDE 25 MG PO TABS: 25.0000 mg | ORAL_TABLET | Freq: Every day | ORAL | 1 refills | Status: DC

## 2022-09-09 MED ORDER — AMLODIPINE BESYLATE 10 MG PO TABS: 10.0000 mg | ORAL_TABLET | Freq: Every day | ORAL | 1 refills | Status: DC

## 2022-09-09 NOTE — Patient Instructions (Addendum)
-  Discontinue Gabapentin at your request.  -Follow up if symptoms become worse with numbness and tingling.  -Refilled Amlodipine and Hydrochlorothiazide for blood pressure.  -Follow up in 9 months.  -Encourage to continue with diet and exercise for weight-loss.

## 2022-09-09 NOTE — Assessment & Plan Note (Addendum)
Diasystolic is slightly elevated today, but last two blood pressures have been stable. Refilled Amlodipine 10mg  HCTZ 25mg  daily. CMP was completed on previous visit with normal results.

## 2022-10-10 ENCOUNTER — Telehealth: Payer: Self-pay | Admitting: *Deleted

## 2022-10-10 ENCOUNTER — Other Ambulatory Visit: Payer: Self-pay | Admitting: *Deleted

## 2022-10-10 MED ORDER — PANTOPRAZOLE SODIUM 20 MG PO TBEC: 20.0000 mg | DELAYED_RELEASE_TABLET | Freq: Every day | ORAL | 11 refills | Status: DC

## 2022-10-10 NOTE — Telephone Encounter (Signed)
Order placed for Protonix 20 mg po daily as per Dr. Russella Dar with noted refill for 1 year.

## 2022-12-06 ENCOUNTER — Other Ambulatory Visit: Payer: Self-pay | Admitting: Physician Assistant

## 2023-01-08 ENCOUNTER — Encounter: Payer: 59 | Admitting: Internal Medicine

## 2023-01-08 NOTE — Progress Notes (Deleted)
Office Visit Note  Patient: Eugene Coleman             Date of Birth: [birth year]           MRN: [id number]             PCP: Alveria Apley, NP Referring: Alveria Apley, NP Visit Date: 01/08/2023 Occupation: @GUAROCC @  Subjective:  No chief complaint on file.   History of Present Illness: Eugene Coleman is a 52 y.o. male ***     Activities of Daily Living:  Patient reports morning stiffness for *** {minute/hour:19697}.   Patient {ACTIONS;DENIES/REPORTS:21021675::"Denies"} nocturnal pain.  Difficulty dressing/grooming: {ACTIONS;DENIES/REPORTS:21021675::"Denies"} Difficulty climbing stairs: {ACTIONS;DENIES/REPORTS:21021675::"Denies"} Difficulty getting out of chair: {ACTIONS;DENIES/REPORTS:21021675::"Denies"} Difficulty using hands for taps, buttons, cutlery, and/or writing: {ACTIONS;DENIES/REPORTS:21021675::"Denies"}  No Rheumatology ROS completed.   PMFS History:  Patient Active Problem List   Diagnosis Date Noted   Globus sensation 04/04/2022   Esophageal dysphagia 04/04/2022   Colon cancer screening 04/04/2022   Benign neoplasm of ascending colon 04/04/2022   Benign neoplasm of cecum 04/04/2022   Benign neoplasm of transverse colon 04/04/2022   Benign neoplasm of descending colon 04/04/2022   Benign neoplasm of sigmoid colon 04/04/2022   Benign neoplasm of rectum 04/04/2022   Difficult airway for intubation    Right inguinal hernia 09/02/2018   Class 1 obesity due to excess calories with serious comorbidity in adult 09/02/2018   History of normal Holter exam 08/26/2017   Primary snoring 07/31/2017   SSRI (selective serotonin reuptake inhibitor) causing adverse effect in therapeutic use 07/22/2017   Multiple thyroid nodules 07/10/2017   Asymmetric blood pressures 07/08/2017   Heart palpitations 07/08/2017   Excessive daytime sleepiness 07/03/2017   At risk for sleep apnea 07/03/2017   Hypertension goal BP (blood pressure) < 130/80 06/18/2017    Rheumatoid factor positive with cyclic citrullinated peptide (CCP) antibody negative 04/29/2017   Family history of autoimmune disorder 04/25/2017   Family history of inflammatory bowel disease 04/25/2017   Ketonuria 04/25/2017   Hypogonadism male 07/13/2014   GERD (gastroesophageal reflux disease) 12/09/2012    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/2012   Multiple thyroid nodules 07/10/2017   Rheumatoid factor positive with cyclic citrullinated peptide (CCP) antibody negative 04/29/2017   Right thyroid nodule 07/10/2017   Small intestinal bacterial overgrowth (SIBO)    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    Family History  Problem Relation Age of Onset   Breast cancer Mother    Diverticulitis Mother    Leukemia Father    Hypertension Father    Lupus Sister    Thyroid disease Brother        Part of thyroid has been removed   Heart attack Maternal Grandfather    Thyroid disease Paternal Grandmother    Heart attack Paternal Grandfather    Stroke Paternal Grandfather    Colon cancer Neg Hx    Colon polyps Neg Hx    Esophageal cancer Neg Hx    Rectal cancer Neg Hx    Past Surgical History:  Procedure Laterality Date   COLONOSCOPY WITH PROPOFOL N/A 04/04/2022  Procedure: COLONOSCOPY WITH PROPOFOL;  Surgeon: Meryl Dare, MD;  Location: [facility];  Service: Gastroenterology;  Laterality: N/A;   ESOPHAGEAL DILATION  04/04/2022   Procedure: ESOPHAGEAL DILATION;  Surgeon: Meryl Dare, MD;  Location: [facility];  Service: Gastroenterology;;   ESOPHAGOGASTRODUODENOSCOPY (EGD) WITH PROPOFOL N/A 04/04/2022   Procedure:  ESOPHAGOGASTRODUODENOSCOPY (EGD) WITH PROPOFOL;  Surgeon: Meryl Dare, MD;  Location: [facility];  Service: Gastroenterology;  Laterality: N/A;   HERNIA REPAIR     HOT HEMOSTASIS N/A 04/04/2022   Procedure: HOT HEMOSTASIS (ARGON PLASMA COAGULATION/BICAP);  Surgeon: Meryl Dare, MD;  Location: [facility];  Service: Gastroenterology;  Laterality: N/A;   INGUINAL HERNIA REPAIR Bilateral    with Vasectomy    KNEE SURGERY Bilateral [phone]   left x 2, right x 1   POLYPECTOMY  04/04/2022   Procedure: POLYPECTOMY;  Surgeon: Meryl Dare, MD;  Location: [facility];  Service: Gastroenterology;;   TONSILLECTOMY     VASECTOMY     WISDOM TOOTH EXTRACTION     childhood   Social History   Social History Narrative   Not on file    There is no immunization history on file for this patient.   Objective: Vital Signs: There were no vitals taken for this visit.   Physical Exam   Musculoskeletal Exam: ***  CDAI Exam: CDAI Score: -- Patient Global: --; Provider Global: -- Swollen: --; Tender: -- Joint Exam 01/08/2023   No joint exam has been documented for this visit   There is currently no information documented on the homunculus. Go to the Rheumatology activity and complete the homunculus joint exam.  Investigation: No additional findings.  Imaging: No results found.  Recent Labs: Lab Results  Component Value Date   WBC 9.4 08/23/2022   HGB 15.9 08/23/2022   PLT 246 08/23/2022   NA 141 08/23/2022   K 3.9 08/23/2022   CL 106 08/23/2022   CO2 25 08/23/2022   GLUCOSE 95 08/23/2022   BUN 19 08/23/2022   CREATININE 0.96 08/23/2022   BILITOT 0.8 08/23/2022   ALKPHOS 46 07/17/2017   AST 22 08/23/2022   ALT 38 08/23/2022   PROT 7.2 08/23/2022   ALBUMIN 4.1 07/17/2017   CALCIUM 9.8 08/23/2022   GFRAA 118 09/15/2018    Speciality Comments: No specialty comments available.  Procedures:  No procedures performed Allergies: Lamictal [lamotrigine],  Lisinopril, Paxil [paroxetine hcl], Zyprexa [olanzapine], and Penicillins   Assessment / Plan:     Visit Diagnoses: No diagnosis found.  Orders: No orders of the defined types were placed in this encounter.  No orders of the defined types were placed in this encounter.   Face-to-face time spent with patient was *** minutes. Greater than 50% of time was spent in counseling and coordination of care.  Follow-Up Instructions: No follow-ups on file.   Fuller Plan, MD  Note - This record has been created using [employer].  Chart creation errors have been sought, but may not always  have been located. Such creation errors do not reflect on  the standard of medical care.

## 2023-03-11 ENCOUNTER — Other Ambulatory Visit: Payer: Self-pay | Admitting: Family Medicine

## 2023-03-11 DIAGNOSIS — I10 Essential (primary) hypertension: Secondary | ICD-10-CM

## 2023-04-02 ENCOUNTER — Encounter: Payer: Self-pay | Admitting: Gastroenterology

## 2023-06-10 ENCOUNTER — Ambulatory Visit: Payer: 59 | Admitting: Family Medicine

## 2023-06-25 ENCOUNTER — Other Ambulatory Visit: Payer: Self-pay

## 2023-06-25 ENCOUNTER — Other Ambulatory Visit: Payer: Self-pay | Admitting: Physician Assistant

## 2023-06-25 MED ORDER — PANTOPRAZOLE SODIUM 20 MG PO TBEC: 20.0000 mg | DELAYED_RELEASE_TABLET | Freq: Every day | ORAL | 0 refills | Status: DC

## 2023-07-28 ENCOUNTER — Other Ambulatory Visit: Payer: Self-pay | Admitting: Medical Genetics

## 2023-07-29 ENCOUNTER — Other Ambulatory Visit (HOSPITAL_COMMUNITY)
Admission: RE | Admit: 2023-07-29 | Discharge: 2023-07-29 | Disposition: A | Discharge status: Home / Self Care | Payer: Self-pay | Source: Ambulatory Visit | Attending: Oncology | Admitting: Oncology

## 2023-08-08 LAB — GENECONNECT MOLECULAR SCREEN: Genetic Analysis Overall Interpretation: NEGATIVE

## 2023-09-24 ENCOUNTER — Other Ambulatory Visit: Payer: Self-pay | Admitting: Family Medicine

## 2023-09-24 DIAGNOSIS — I10 Essential (primary) hypertension: Secondary | ICD-10-CM

## 2023-10-22 ENCOUNTER — Emergency Department (HOSPITAL_COMMUNITY)

## 2023-10-22 ENCOUNTER — Encounter (HOSPITAL_COMMUNITY): Payer: Self-pay

## 2023-10-22 ENCOUNTER — Emergency Department (HOSPITAL_COMMUNITY)
Admission: EM | Admit: 2023-10-22 | Discharge: 2023-10-22 | Disposition: A | Discharge status: Home / Self Care | Attending: Emergency Medicine | Admitting: Emergency Medicine

## 2023-10-22 ENCOUNTER — Other Ambulatory Visit: Payer: Self-pay

## 2023-10-22 DIAGNOSIS — R1032 Left lower quadrant pain: Secondary | ICD-10-CM | POA: Diagnosis present

## 2023-10-22 DIAGNOSIS — N200 Calculus of kidney: Secondary | ICD-10-CM

## 2023-10-22 DIAGNOSIS — N132 Hydronephrosis with renal and ureteral calculous obstruction: Secondary | ICD-10-CM | POA: Insufficient documentation

## 2023-10-22 LAB — URINALYSIS, ROUTINE W REFLEX MICROSCOPIC
Bacteria, UA: NONE SEEN
Bilirubin Urine: NEGATIVE
Glucose, UA: NEGATIVE mg/dL
Ketones, ur: 20 mg/dL — AB
Leukocytes,Ua: NEGATIVE
Nitrite: NEGATIVE
Protein, ur: NEGATIVE mg/dL
RBC / HPF: >50 RBC/hpf (ref 0–5)
Specific Gravity, Urine: 1.019 (ref 1.005–1.030)
pH: 5 (ref 5.0–8.0)

## 2023-10-22 LAB — CBC WITH DIFFERENTIAL/PLATELET
Abs Immature Granulocytes: 0.02 K/uL (ref 0.00–0.07)
Basophils Absolute: 0 K/uL (ref 0.0–0.1)
Basophils Relative: 0 %
Eosinophils Absolute: 0 K/uL (ref 0.0–0.5)
Eosinophils Relative: 0 %
HCT: 43.6 % (ref 39.0–52.0)
Hemoglobin: 15 g/dL (ref 13.0–17.0)
Immature Granulocytes: 0 %
Lymphocytes Relative: 17 %
Lymphs Abs: 1.5 K/uL (ref 0.7–4.0)
MCH: 30.7 pg (ref 26.0–34.0)
MCHC: 34.4 g/dL (ref 30.0–36.0)
MCV: 89.2 fL (ref 80.0–100.0)
Monocytes Absolute: 0.6 K/uL (ref 0.1–1.0)
Monocytes Relative: 6 %
Neutro Abs: 7 K/uL (ref 1.7–7.7)
Neutrophils Relative %: 77 %
Platelets: 209 K/uL (ref 150–400)
RBC: 4.89 MIL/uL (ref 4.22–5.81)
RDW: 13.1 % (ref 11.5–15.5)
WBC: 9.2 K/uL (ref 4.0–10.5)
nRBC: 0 % (ref 0.0–0.2)

## 2023-10-22 LAB — COMPREHENSIVE METABOLIC PANEL WITH GFR
ALT: 22 U/L (ref 0–44)
AST: 18 U/L (ref 15–41)
Albumin: 4 g/dL (ref 3.5–5.0)
Alkaline Phosphatase: 50 U/L (ref 38–126)
Anion gap: 9 (ref 5–15)
BUN: 11 mg/dL (ref 6–20)
CO2: 22 mmol/L (ref 22–32)
Calcium: 9 mg/dL (ref 8.9–10.3)
Chloride: 108 mmol/L (ref 98–111)
Creatinine, Ser: 0.89 mg/dL (ref 0.61–1.24)
GFR, Estimated: >60 mL/min (ref >60)
Glucose, Bld: 104 mg/dL — ABNORMAL HIGH (ref 70–99)
Potassium: 3.6 mmol/L (ref 3.5–5.1)
Sodium: 139 mmol/L (ref 135–145)
Total Bilirubin: 1.1 mg/dL (ref 0.0–1.2)
Total Protein: 6.5 g/dL (ref 6.5–8.1)

## 2023-10-22 MED ORDER — OXYCODONE-ACETAMINOPHEN 5-325 MG PO TABS: 1.0000 | ORAL_TABLET | ORAL | 0 refills | Status: DC | PRN

## 2023-10-22 MED ORDER — HYDROMORPHONE HCL 1 MG/ML IJ SOLN
0.5000 mg | Freq: Once | INTRAMUSCULAR | Status: DC
  Filled 2023-10-22: qty 0.5

## 2023-10-22 MED ORDER — TAMSULOSIN HCL 0.4 MG PO CAPS
0.4000 mg | ORAL_CAPSULE | Freq: Every day | ORAL | 0 refills | Status: DC
Start: 2023-10-22 — End: 2023-11-05

## 2023-10-22 MED ORDER — ONDANSETRON HCL 4 MG PO TABS: 4.0000 mg | ORAL_TABLET | Freq: Four times a day (QID) | ORAL | 0 refills | Status: AC

## 2023-10-22 NOTE — ED Provider Notes (Incomplete)
 [place] EMERGENCY DEPARTMENT AT [facility] Provider Note   CSN: [id number] Arrival date & time: 10/22/23  1345     Patient presents with: Flank Pain   Eugene Coleman is a 53 y.o. male.  {Add pertinent medical, surgical, social history, OB history to HPI:32947}  Flank Pain Associated symptoms include abdominal pain. Pertinent negatives include no chest pain and no shortness of breath.       Eugene Coleman is a 53 y.o. male with hx of anxiety disorder, GERD, IBS, who presents to the Emergency Department complaining of sudden onset of dark colored urine yesterday and later flank and LLQ pain.  He endorses multiple episodes of having urine that looks like tea  he describes waxing and waning sharp pain to his left flank.  Occasionally pain radiates to his left groin area as well.  He denies fever, vomiting, or pain with urination.  No history of kidney stones.      Prior to Admission medications   Medication Sig Start Date End Date Taking? Authorizing Provider  acetaminophen  (TYLENOL ) 500 MG tablet Take 500 mg by mouth every 6 (six) hours as needed for moderate pain (pain score 4-6).   Yes [provider]  amLODipine  (NORVASC ) 10 MG tablet TAKE 1 TABLET BY MOUTH EVERY DAY 03/11/23  Yes Williamson, Joanna R, NP  METHYLENE BLUE PO Take by mouth.   Yes [provider]  Multiple Vitamins-Minerals (MULTIVITAMIN WITH MINERALS) tablet Take 1 tablet by mouth daily.   Yes [provider]  niacin (VITAMIN B3) 500 MG tablet Take 500 mg by mouth daily after breakfast.   Yes [provider]  pantoprazole  (PROTONIX ) 20 MG tablet Take 1 tablet (20 mg total) by mouth daily. Patient needs follow up appointment for future refills. Please call [phone] to schedule an appointment. 06/25/23  Yes Craig Palma R, PA-C  hydrochlorothiazide  (HYDRODIURIL ) 25 MG tablet TAKE 1 TABLET (25 MG TOTAL) BY MOUTH DAILY. Patient not taking: Reported on 10/22/2023  03/11/23   Billy Philippe SAUNDERS, NP    Allergies: Lamictal [lamotrigine], Lisinopril , Paxil  [paroxetine  hcl], Zyprexa [olanzapine], and Penicillins    Review of Systems  Constitutional:  Negative for chills and fever.  Respiratory:  Negative for shortness of breath.   Cardiovascular:  Negative for chest pain.  Gastrointestinal:  Positive for abdominal pain and nausea. Negative for diarrhea and vomiting.  Genitourinary:  Positive for flank pain and hematuria. Negative for decreased urine volume and difficulty urinating.  Skin:  Negative for rash.  Neurological:  Negative for dizziness and weakness.    Updated Vital Signs BP (!) 145/91 (BP Location: Right Arm)   Pulse 75   Temp 98.4 F (36.9 C) (Oral)   Resp 17   Ht 6' 3 (1.905 m)   Wt 111.1 kg   SpO2 97%   BMI 30.62 kg/m   Physical Exam Vitals and nursing note reviewed.  Constitutional:      General: He is not in acute distress.    Appearance: Normal appearance. He is not toxic-appearing.  HENT:     Mouth/Throat:     Mouth: Mucous membranes are moist.  Cardiovascular:     Rate and Rhythm: Normal rate and regular rhythm.     Pulses: Normal pulses.  Pulmonary:     Effort: Pulmonary effort is normal.  Abdominal:     General: There is no distension.     Palpations: Abdomen is soft.     Tenderness: There is no abdominal tenderness. There  is no right CVA tenderness or left CVA tenderness.  Musculoskeletal:        General: Normal range of motion.  Skin:    General: Skin is warm.  Neurological:     General: No focal deficit present.     Mental Status: He is alert.     (all labs ordered are listed, but only abnormal results are displayed) Labs Reviewed  CBC WITH DIFFERENTIAL/PLATELET  URINALYSIS, ROUTINE W REFLEX MICROSCOPIC  COMPREHENSIVE METABOLIC PANEL WITH GFR    EKG: None  Radiology: No results found.  {Document cardiac monitor, telemetry assessment procedure when appropriate:32947} Procedures    Medications Ordered in the ED  HYDROmorphone  (DILAUDID ) injection 0.5 mg (has no administration in time range)      {Click here for ABCD2, HEART and other calculators REFRESH Note before signing:1}                              Medical Decision Making Patient here with sudden onset left flank pain with dark, tea colored urine beginning yesterday.  He has had waxing and waning pain to his left flank that occasionally radiates into his left groin area.  Nausea but no vomiting.  No fever or chills.  No history of kidney stones  He states flank pain was severe shortly before ER arrival but somewhat improved at this time.  He took Zofran  2 hours prior to ER arrival.  Denies any nausea at this time.  I suspect symptoms are related to kidney stone, pyelonephritis, cystitis also considered.  Musculoskeletal symptoms also considered but felt less likely given setting of radiating flank pain and hematuria  Amount and/or Complexity of Data Reviewed Labs: ordered. Radiology: ordered. Discussion of management or test interpretation with external provider(s): Pt was offered pain medication but declined stating that he is not having significant pain at present  Risk Prescription drug management.     {Document critical care time when appropriate  Document review of labs and clinical decision tools ie CHADS2VASC2, etc  Document your independent review of radiology images and any outside records  Document your discussion with family members, caretakers and with consultants  Document social determinants of health affecting pt's care  Document your decision making why or why not admission, treatments were needed:32947:::1}   Final diagnoses:  None    ED Discharge Orders     None

## 2023-10-22 NOTE — Group Note (Deleted)
 Date:  10/22/2023 Time:  2:22 PM  Group Topic/Focus:  Wellness Toolbox:   The focus of this group is to discuss various aspects of wellness, balancing those aspects and exploring ways to increase the ability to experience wellness.  Patients will create a wellness toolbox for use upon discharge.     Participation Level:  {BHH PARTICIPATION OZCZO:77735}  Participation Quality:  {BHH PARTICIPATION QUALITY:22265}  Affect:  {BHH AFFECT:22266}  Cognitive:  {BHH COGNITIVE:22267}  Insight: {BHH Insight2:20797}  Engagement in Group:  {BHH ENGAGEMENT IN HMNLE:77731}  Modes of Intervention:  {BHH MODES OF INTERVENTION:22269}  Additional Comments:  ***  Myra Curtistine BROCKS 10/22/2023, 2:22 PM

## 2023-10-22 NOTE — ED Triage Notes (Signed)
 Pt reports left side flank pain with tea colored urine since yesterday.

## 2023-10-22 NOTE — ED Provider Notes (Signed)
 [place] EMERGENCY DEPARTMENT AT [facility] Provider Note   CSN: [id number] Arrival date & time: 10/22/23  1345     Patient presents with: Flank Pain   Eugene Coleman is a 53 y.o. male.    Flank Pain Associated symptoms include abdominal pain. Pertinent negatives include no chest pain and no shortness of breath.       Eugene Coleman is a 53 y.o. male with hx of anxiety disorder, GERD, IBS, who presents to the Emergency Department complaining of sudden onset of dark colored urine yesterday and later flank and LLQ pain.  He endorses multiple episodes of having urine that looks like tea  he describes waxing and waning sharp pain to his left flank.  Occasionally pain radiates to his left groin area as well.  He denies fever, vomiting, or pain with urination.  No history of kidney stones.      Prior to Admission medications   Medication Sig Start Date End Date Taking? Authorizing Provider  acetaminophen  (TYLENOL ) 500 MG tablet Take 500 mg by mouth every 6 (six) hours as needed for moderate pain (pain score 4-6).   Yes [provider]  amLODipine  (NORVASC ) 10 MG tablet TAKE 1 TABLET BY MOUTH EVERY DAY 03/11/23  Yes Williamson, Joanna R, NP  METHYLENE BLUE PO Take by mouth.   Yes [provider]  Multiple Vitamins-Minerals (MULTIVITAMIN WITH MINERALS) tablet Take 1 tablet by mouth daily.   Yes [provider]  niacin (VITAMIN B3) 500 MG tablet Take 500 mg by mouth daily after breakfast.   Yes [provider]  pantoprazole  (PROTONIX ) 20 MG tablet Take 1 tablet (20 mg total) by mouth daily. Patient needs follow up appointment for future refills. Please call [phone] to schedule an appointment. 06/25/23  Yes Craig Palma R, PA-C  hydrochlorothiazide  (HYDRODIURIL ) 25 MG tablet TAKE 1 TABLET (25 MG TOTAL) BY MOUTH DAILY. Patient not taking: Reported on 10/22/2023 03/11/23   Billy Philippe SAUNDERS, NP    Allergies: Lamictal  [lamotrigine], Lisinopril , Paxil  [paroxetine  hcl], Zyprexa [olanzapine], and Penicillins    Review of Systems  Constitutional:  Negative for chills and fever.  Respiratory:  Negative for shortness of breath.   Cardiovascular:  Negative for chest pain.  Gastrointestinal:  Positive for abdominal pain and nausea. Negative for diarrhea and vomiting.  Genitourinary:  Positive for flank pain and hematuria. Negative for decreased urine volume and difficulty urinating.  Skin:  Negative for rash.  Neurological:  Negative for dizziness and weakness.    Updated Vital Signs BP (!) 145/91 (BP Location: Right Arm)   Pulse 75   Temp 98.4 F (36.9 C) (Oral)   Resp 17   Ht 6' 3 (1.905 m)   Wt 111.1 kg   SpO2 97%   BMI 30.62 kg/m   Physical Exam Vitals and nursing note reviewed.  Constitutional:      General: He is not in acute distress.    Appearance: Normal appearance. He is not toxic-appearing.  HENT:     Mouth/Throat:     Mouth: Mucous membranes are moist.  Cardiovascular:     Rate and Rhythm: Normal rate and regular rhythm.     Pulses: Normal pulses.  Pulmonary:     Effort: Pulmonary effort is normal.  Abdominal:     General: There is no distension.     Palpations: Abdomen is soft.     Tenderness: There is no abdominal tenderness. There is no right CVA tenderness or left CVA tenderness.  Musculoskeletal:        General: Normal range of motion.  Skin:    General: Skin is warm.  Neurological:     General: No focal deficit present.     Mental Status: He is alert.     (all labs ordered are listed, but only abnormal results are displayed) Labs Reviewed  URINALYSIS, ROUTINE W REFLEX MICROSCOPIC - Abnormal; Notable for the following components:      Result Value   Hgb urine dipstick SMALL (*)    Ketones, ur 20 (*)    All other components within normal limits  COMPREHENSIVE METABOLIC PANEL WITH GFR - Abnormal; Notable for the following components:   Glucose, Bld 104 (*)    All  other components within normal limits  CBC WITH DIFFERENTIAL/PLATELET    EKG: None  Radiology: CT Renal Stone Study Result Date: 10/22/2023 CLINICAL DATA:  Abdominal/flank pain, stone suspected left flank pain EXAM: CT ABDOMEN AND PELVIS WITHOUT CONTRAST TECHNIQUE: Multidetector CT imaging of the abdomen and pelvis was performed following the standard protocol without IV contrast. RADIATION DOSE REDUCTION: This exam was performed according to the departmental dose-optimization program which includes automated exposure control, adjustment of the mA and/or kV according to patient size and/or use of iterative reconstruction technique. COMPARISON:  April 02, 2017 FINDINGS: Of note, the lack of intravenous contrast limits evaluation of the solid organ parenchyma and vascularity. Lower chest: No focal airspace consolidation or pleural effusion. Hepatobiliary: Unchanged 2.5 cm cyst in the right hepatic lobe.Cholecystectomy. No intrahepatic or extrahepatic biliary ductal dilation. Pancreas: No mass or main ductal dilation. No peripancreatic inflammation or fluid collection. Spleen: Normal size. No mass. Adrenals/Urinary Tract: No adrenal masses. No renal mass. Punctate nonobstructive right upper pole calculus. Minimal left-sided hydronephrosis due to an obstructive 3 x 3 x 5 mm calculus at the left UPJ. The urinary bladder is distended without focal abnormality. Stomach/Bowel: The stomach is decompressed without focal abnormality. No small bowel wall thickening or inflammation. No small bowel obstruction.Normal appendix. Vascular/Lymphatic: No aortic aneurysm. No intraabdominal or pelvic lymphadenopathy. Reproductive: No prostatomegaly. No free pelvic fluid. Other: No pneumoperitoneum or ascites. Musculoskeletal: No acute fracture or destructive lesion. Multilevel degenerative disc disease of the spine. Small fat containing epigastric ventral abdominal hernia. No findings of vascular compromise. IMPRESSION:  Minimal left-sided hydronephrosis due to an obstructive calculus measuring 3 x 3 x 5 mm at the left UPJ. Electronically Signed   By: Rogelia Myers M.D.   On: 10/22/2023 18:33      Procedures   Medications Ordered in the ED  HYDROmorphone  (DILAUDID ) injection 0.5 mg (has no administration in time range)                                    Medical Decision Making Patient here with sudden onset left flank pain with dark, tea colored urine beginning yesterday.  He has had waxing and waning pain to his left flank that occasionally radiates into his left groin area.  Nausea but no vomiting.  No fever or chills.  No history of kidney stones  He states flank pain was severe shortly before ER arrival but somewhat improved at this time.  He took Zofran  2 hours prior to ER arrival.  Denies any nausea at this time.  I suspect symptoms are related to kidney stone, pyelonephritis, cystitis also considered.  Musculoskeletal symptoms also considered but felt less likely given setting of radiating  flank pain and hematuria  Amount and/or Complexity of Data Reviewed Labs: ordered.    Details: Labs show no leukocytosis chemistries are reassuring.  His LFTs are unremarkable.  Urinalysis shows small amount of blood without evidence of infection and there is no dysuria symptoms. Radiology: ordered.    Details: CT renal stone study shows a 3 x 3 x 5 mm stone at the left UPJ with mild left-sided hydronephrosis Discussion of management or test interpretation with external provider(s): Pt was offered pain medication but declined stating that he is not having significant pain at present  On my recheck, patient is resting comfortably.  He has not appeared to have significant pain during ER stay and there has been no vomiting.  I feel that he is appropriate for discharge home at this time, will provide pain medication, Flomax , and Zofran .  Have given follow-up information for urology.  I have advised him to return  to the ER for any new or worsening symptoms and he is agreeable to plan.  Risk Prescription drug management.        Final diagnoses:  Kidney stone    ED Discharge Orders          Ordered    oxyCODONE -acetaminophen  (PERCOCET/ROXICET) 5-325 MG tablet  Every 4 hours PRN        10/22/23 1859    ondansetron  (ZOFRAN ) 4 MG tablet  Every 6 hours        10/22/23 1859    tamsulosin  (FLOMAX ) 0.4 MG CAPS capsule  Daily        10/22/23 1859               Herlinda Milling, PA-C 10/24/23 1800    Francesca Elsie CROME, MD 10/30/23 1717

## 2023-10-22 NOTE — Discharge Instructions (Signed)
 Your workup today shows that you have a left-sided kidney stone.  You may be able to pass the stone on your own but is also possible that you may not pass it.  I recommend that you call the urology office listed to arrange a follow-up appointment.  Observe your urine for passage of the stone.  Take pain medication and nausea medicine as directed if needed.  Take the Flomax  daily.  Return to the emergency department for any new or worsening symptoms.

## 2023-10-23 ENCOUNTER — Other Ambulatory Visit: Payer: Self-pay

## 2023-10-23 ENCOUNTER — Emergency Department (HOSPITAL_COMMUNITY)
Admission: EM | Admit: 2023-10-23 | Discharge: 2023-10-24 | Disposition: A | Discharge status: Home / Self Care | Attending: Emergency Medicine | Admitting: Emergency Medicine

## 2023-10-23 ENCOUNTER — Encounter (HOSPITAL_COMMUNITY): Payer: Self-pay | Admitting: Emergency Medicine

## 2023-10-23 DIAGNOSIS — D72829 Elevated white blood cell count, unspecified: Secondary | ICD-10-CM | POA: Insufficient documentation

## 2023-10-23 DIAGNOSIS — R109 Unspecified abdominal pain: Secondary | ICD-10-CM | POA: Diagnosis present

## 2023-10-23 DIAGNOSIS — N132 Hydronephrosis with renal and ureteral calculous obstruction: Secondary | ICD-10-CM | POA: Diagnosis not present

## 2023-10-23 DIAGNOSIS — N200 Calculus of kidney: Secondary | ICD-10-CM

## 2023-10-23 LAB — URINALYSIS, ROUTINE W REFLEX MICROSCOPIC
Bilirubin Urine: NEGATIVE
Glucose, UA: NEGATIVE mg/dL
Hgb urine dipstick: NEGATIVE
Ketones, ur: 80 mg/dL — AB
Leukocytes,Ua: NEGATIVE
Nitrite: NEGATIVE
Protein, ur: NEGATIVE mg/dL
Specific Gravity, Urine: 1.023 (ref 1.005–1.030)
pH: 5 (ref 5.0–8.0)

## 2023-10-23 LAB — CBC
HCT: 44.1 % (ref 39.0–52.0)
Hemoglobin: 15.3 g/dL (ref 13.0–17.0)
MCH: 31.2 pg (ref 26.0–34.0)
MCHC: 34.7 g/dL (ref 30.0–36.0)
MCV: 90 fL (ref 80.0–100.0)
Platelets: 220 K/uL (ref 150–400)
RBC: 4.9 MIL/uL (ref 4.22–5.81)
RDW: 13.3 % (ref 11.5–15.5)
WBC: 14.3 K/uL — ABNORMAL HIGH (ref 4.0–10.5)
nRBC: 0 % (ref 0.0–0.2)

## 2023-10-23 LAB — BASIC METABOLIC PANEL WITH GFR
Anion gap: 11 (ref 5–15)
BUN: 18 mg/dL (ref 6–20)
CO2: 21 mmol/L — ABNORMAL LOW (ref 22–32)
Calcium: 9 mg/dL (ref 8.9–10.3)
Chloride: 106 mmol/L (ref 98–111)
Creatinine, Ser: 1.52 mg/dL — ABNORMAL HIGH (ref 0.61–1.24)
GFR, Estimated: 54 mL/min — ABNORMAL LOW (ref >60)
Glucose, Bld: 120 mg/dL — ABNORMAL HIGH (ref 70–99)
Potassium: 3.7 mmol/L (ref 3.5–5.1)
Sodium: 138 mmol/L (ref 135–145)

## 2023-10-23 MED ORDER — HYDROMORPHONE HCL 1 MG/ML IJ SOLN
1.0000 mg | Freq: Once | INTRAMUSCULAR | Status: AC
  Administered 2023-10-23: 1 mg via INTRAVENOUS
  Filled 2023-10-23: qty 1

## 2023-10-23 MED ORDER — KETOROLAC TROMETHAMINE 30 MG/ML IJ SOLN
30.0000 mg | Freq: Once | INTRAMUSCULAR | Status: AC
  Administered 2023-10-23: 30 mg via INTRAVENOUS
  Filled 2023-10-23: qty 1

## 2023-10-23 MED ORDER — ONDANSETRON HCL 4 MG/2ML IJ SOLN
4.0000 mg | Freq: Once | INTRAMUSCULAR | Status: AC
  Administered 2023-10-23: 4 mg via INTRAVENOUS
  Filled 2023-10-23: qty 2

## 2023-10-23 MED ORDER — SODIUM CHLORIDE 0.9 % IV BOLUS
1000.0000 mL | Freq: Once | INTRAVENOUS | Status: AC
  Administered 2023-10-23: 1000 mL via INTRAVENOUS

## 2023-10-23 NOTE — ED Triage Notes (Signed)
 Pt seen here yesterday for same.  Has a kidney stone.  Took flomax  and pain meds and pain has increased now with vomiting as well.

## 2023-10-23 NOTE — Discharge Instructions (Addendum)
 Your testing shows that you have no signs of infection in your urine, your blood work showed a little bit of dehydration which is why we gave you IV fluids  I would recommend that you call the urologist, please see the phone number above, you should be seen either tomorrow or Monday, continue taking the tamsulosin  as well as the pain medicine you are prescribed, Zofran  as needed for nausea

## 2023-10-23 NOTE — ED Provider Notes (Addendum)
 [place] EMERGENCY DEPARTMENT AT [facility] Provider Note   CSN: [id number] Arrival date & time: 10/23/23  2142     Patient presents with: Flank Pain   Eugene Coleman is a 53 y.o. male.    Flank Pain   Patient is a 53 year old male, he has a history of a known kidney stone which was diagnosed yesterday for the first time.  Left-sided, it was proximal near the UPJ, approximately 3 x 3 x 5 mm in size, he was taking Flomax , after taking the Flomax  this morning his pain became much worse and has been present for the most part today on and off with some radiation to the left lower quadrant.  There has been nausea and dry heaving, no fevers or chills, urinalysis yesterday was clean.  Given pain medication which she last took about 2 hours ago ago without much relief    Prior to Admission medications   Medication Sig Start Date End Date Taking? Authorizing Provider  amLODipine  (NORVASC ) 10 MG tablet TAKE 1 TABLET BY MOUTH EVERY DAY 03/11/23   Williamson, Joanna R, NP  hydrochlorothiazide  (HYDRODIURIL ) 25 MG tablet TAKE 1 TABLET (25 MG TOTAL) BY MOUTH DAILY. Patient not taking: Reported on 10/22/2023 03/11/23   Williamson, Joanna R, NP  METHYLENE BLUE PO Take by mouth.    [provider]  Multiple Vitamins-Minerals (MULTIVITAMIN WITH MINERALS) tablet Take 1 tablet by mouth daily.    [provider]  niacin (VITAMIN B3) 500 MG tablet Take 500 mg by mouth daily after breakfast.    [provider]  ondansetron  (ZOFRAN ) 4 MG tablet Take 1 tablet (4 mg total) by mouth every 6 (six) hours. As needed for nausea vomiting 10/22/23   Triplett, Tammy, PA-C  oxyCODONE -acetaminophen  (PERCOCET/ROXICET) 5-325 MG tablet Take 1 tablet by mouth every 4 (four) hours as needed. 10/22/23   Triplett, Tammy, PA-C  pantoprazole  (PROTONIX ) 20 MG tablet Take 1 tablet (20 mg total) by mouth daily. Patient needs follow up appointment for future refills. Please call [phone] to  schedule an appointment. 06/25/23   Craig Alan SAUNDERS, PA-C  tamsulosin  (FLOMAX ) 0.4 MG CAPS capsule Take 1 capsule (0.4 mg total) by mouth daily. 10/22/23   Triplett, Tammy, PA-C    Allergies: Lamictal [lamotrigine], Lisinopril , Paxil  [paroxetine  hcl], Zyprexa [olanzapine], and Penicillins    Review of Systems  Genitourinary:  Positive for flank pain.  All other systems reviewed and are negative.   Updated Vital Signs BP (!) 154/84   Pulse 70   Temp 98.2 F (36.8 C) (Oral)   Resp 18   SpO2 97%   Physical Exam Vitals and nursing note reviewed.  Constitutional:      General: He is not in acute distress.    Appearance: He is well-developed.  HENT:     Head: Normocephalic and atraumatic.     Mouth/Throat:     Pharynx: No oropharyngeal exudate.  Eyes:     General: No scleral icterus.       Right eye: No discharge.        Left eye: No discharge.     Conjunctiva/sclera: Conjunctivae normal.     Pupils: Pupils are equal, round, and reactive to light.  Neck:     Thyroid : No thyromegaly.     Vascular: No JVD.  Cardiovascular:     Rate and Rhythm: Normal rate and regular rhythm.     Heart sounds: Normal heart sounds. No murmur heard.    No friction rub. No  gallop.  Pulmonary:     Effort: Pulmonary effort is normal. No respiratory distress.     Breath sounds: Normal breath sounds. No wheezing or rales.  Abdominal:     General: Bowel sounds are normal. There is no distension.     Palpations: Abdomen is soft. There is no mass.     Tenderness: There is no abdominal tenderness.     Comments: CVA tenderness, abdomen is nontender  Musculoskeletal:        General: No tenderness. Normal range of motion.     Cervical back: Normal range of motion and neck supple.     Right lower leg: No edema.     Left lower leg: No edema.  Lymphadenopathy:     Cervical: No cervical adenopathy.  Skin:    General: Skin is warm and dry.     Findings: No erythema or rash.  Neurological:     Mental  Status: He is alert.     Coordination: Coordination normal.  Psychiatric:        Behavior: Behavior normal.     (all labs ordered are listed, but only abnormal results are displayed) Labs Reviewed  URINALYSIS, ROUTINE W REFLEX MICROSCOPIC  BASIC METABOLIC PANEL WITH GFR  CBC    EKG: None  Radiology: CT Renal Stone Study Result Date: 10/22/2023 CLINICAL DATA:  Abdominal/flank pain, stone suspected left flank pain EXAM: CT ABDOMEN AND PELVIS WITHOUT CONTRAST TECHNIQUE: Multidetector CT imaging of the abdomen and pelvis was performed following the standard protocol without IV contrast. RADIATION DOSE REDUCTION: This exam was performed according to the departmental dose-optimization program which includes automated exposure control, adjustment of the mA and/or kV according to patient size and/or use of iterative reconstruction technique. COMPARISON:  April 02, 2017 FINDINGS: Of note, the lack of intravenous contrast limits evaluation of the solid organ parenchyma and vascularity. Lower chest: No focal airspace consolidation or pleural effusion. Hepatobiliary: Unchanged 2.5 cm cyst in the right hepatic lobe.Cholecystectomy. No intrahepatic or extrahepatic biliary ductal dilation. Pancreas: No mass or main ductal dilation. No peripancreatic inflammation or fluid collection. Spleen: Normal size. No mass. Adrenals/Urinary Tract: No adrenal masses. No renal mass. Punctate nonobstructive right upper pole calculus. Minimal left-sided hydronephrosis due to an obstructive 3 x 3 x 5 mm calculus at the left UPJ. The urinary bladder is distended without focal abnormality. Stomach/Bowel: The stomach is decompressed without focal abnormality. No small bowel wall thickening or inflammation. No small bowel obstruction.Normal appendix. Vascular/Lymphatic: No aortic aneurysm. No intraabdominal or pelvic lymphadenopathy. Reproductive: No prostatomegaly. No free pelvic fluid. Other: No pneumoperitoneum or ascites.  Musculoskeletal: No acute fracture or destructive lesion. Multilevel degenerative disc disease of the spine. Small fat containing epigastric ventral abdominal hernia. No findings of vascular compromise. IMPRESSION: Minimal left-sided hydronephrosis due to an obstructive calculus measuring 3 x 3 x 5 mm at the left UPJ. Electronically Signed   By: Rogelia Myers M.D.   On: 10/22/2023 18:33     Procedures   Medications Ordered in the ED  ketorolac  (TORADOL ) 30 MG/ML injection 30 mg (has no administration in time range)  HYDROmorphone  (DILAUDID ) injection 1 mg (has no administration in time range)  ondansetron  (ZOFRAN ) injection 4 mg (has no administration in time range)                                    Medical Decision Making Amount and/or Complexity of Data Reviewed Labs:  ordered.  Risk Prescription drug management.   Overall this patient appears to have minimal left-sided hydronephrosis with a proximal UPJ stone which I suspect is moving hence the pain.  Vital signs show some hypertension but no tachycardia or fever.  Will be given pain medication including hydromorphone  and ketorolac  as well as Zofran .  Patient is agreeable to the plan.  CBC with slight leukocytosis, metabolic panel with a creatinine of 1.5 which is up from yesterday, urinalysis pending  IV fluids given  After Toradol  and Dilaudid  and Zofran  the patient states that his pain is completely gone  At change of shift - UA pending Dr. Haze night EDP to dispo     Final diagnoses:  None    ED Discharge Orders     None          Cleotilde Rogue, MD 10/24/23 RITA    Cleotilde Rogue, MD 10/24/23 2008

## 2023-10-25 ENCOUNTER — Other Ambulatory Visit: Payer: Self-pay

## 2023-10-25 ENCOUNTER — Encounter (HOSPITAL_COMMUNITY): Payer: Self-pay

## 2023-10-25 ENCOUNTER — Emergency Department (HOSPITAL_COMMUNITY)
Admission: EM | Admit: 2023-10-25 | Discharge: 2023-10-25 | Disposition: A | Discharge status: Home / Self Care | Attending: Emergency Medicine | Admitting: Emergency Medicine

## 2023-10-25 DIAGNOSIS — Z5329 Procedure and treatment not carried out because of patient's decision for other reasons: Secondary | ICD-10-CM | POA: Insufficient documentation

## 2023-10-25 DIAGNOSIS — R109 Unspecified abdominal pain: Secondary | ICD-10-CM | POA: Diagnosis present

## 2023-10-25 LAB — BASIC METABOLIC PANEL WITH GFR
Anion gap: 10 (ref 5–15)
BUN: 18 mg/dL (ref 6–20)
CO2: 24 mmol/L (ref 22–32)
Calcium: 9.6 mg/dL (ref 8.9–10.3)
Chloride: 106 mmol/L (ref 98–111)
Creatinine, Ser: 1.12 mg/dL (ref 0.61–1.24)
GFR, Estimated: >60 mL/min (ref >60)
Glucose, Bld: 107 mg/dL — ABNORMAL HIGH (ref 70–99)
Potassium: 3.9 mmol/L (ref 3.5–5.1)
Sodium: 140 mmol/L (ref 135–145)

## 2023-10-25 LAB — CBC WITH DIFFERENTIAL/PLATELET
Abs Immature Granulocytes: 0.02 K/uL (ref 0.00–0.07)
Basophils Absolute: 0.1 K/uL (ref 0.0–0.1)
Basophils Relative: 1 %
Eosinophils Absolute: 0.1 K/uL (ref 0.0–0.5)
Eosinophils Relative: 1 %
HCT: 42.4 % (ref 39.0–52.0)
Hemoglobin: 14.6 g/dL (ref 13.0–17.0)
Immature Granulocytes: 0 %
Lymphocytes Relative: 15 %
Lymphs Abs: 1.6 K/uL (ref 0.7–4.0)
MCH: 31.1 pg (ref 26.0–34.0)
MCHC: 34.4 g/dL (ref 30.0–36.0)
MCV: 90.2 fL (ref 80.0–100.0)
Monocytes Absolute: 0.7 K/uL (ref 0.1–1.0)
Monocytes Relative: 7 %
Neutro Abs: 8.2 K/uL — ABNORMAL HIGH (ref 1.7–7.7)
Neutrophils Relative %: 76 %
Platelets: 214 K/uL (ref 150–400)
RBC: 4.7 MIL/uL (ref 4.22–5.81)
RDW: 13.4 % (ref 11.5–15.5)
WBC: 10.7 K/uL — ABNORMAL HIGH (ref 4.0–10.5)
nRBC: 0 % (ref 0.0–0.2)

## 2023-10-25 MED ORDER — ONDANSETRON 4 MG PO TBDP: 4.0000 mg | ORAL_TABLET | Freq: Once | ORAL | Status: DC

## 2023-10-25 MED ORDER — KETOROLAC TROMETHAMINE 30 MG/ML IJ SOLN: 60.0000 mg | Freq: Once | INTRAMUSCULAR | Status: DC

## 2023-10-25 NOTE — ED Triage Notes (Signed)
 Pt has confirmed kidney stone on his left side. Pt taken flomax  and pain meds already but is now having nausea

## 2023-10-25 NOTE — ED Notes (Signed)
 Pt stated his pain had subsided and he wished to leave the ED.

## 2023-10-26 ENCOUNTER — Encounter (HOSPITAL_COMMUNITY): Payer: Self-pay | Admitting: Emergency Medicine

## 2023-10-26 ENCOUNTER — Other Ambulatory Visit: Payer: Self-pay

## 2023-10-26 ENCOUNTER — Emergency Department (HOSPITAL_COMMUNITY)
Admission: EM | Admit: 2023-10-26 | Discharge: 2023-10-26 | Disposition: A | Discharge status: Home / Self Care | Attending: Emergency Medicine | Admitting: Emergency Medicine

## 2023-10-26 ENCOUNTER — Emergency Department (HOSPITAL_COMMUNITY)

## 2023-10-26 DIAGNOSIS — R109 Unspecified abdominal pain: Secondary | ICD-10-CM | POA: Diagnosis present

## 2023-10-26 DIAGNOSIS — N132 Hydronephrosis with renal and ureteral calculous obstruction: Secondary | ICD-10-CM | POA: Insufficient documentation

## 2023-10-26 DIAGNOSIS — N200 Calculus of kidney: Secondary | ICD-10-CM

## 2023-10-26 LAB — CBC WITH DIFFERENTIAL/PLATELET
Abs Immature Granulocytes: 0.05 K/uL (ref 0.00–0.07)
Basophils Absolute: 0 K/uL (ref 0.0–0.1)
Basophils Relative: 0 %
Eosinophils Absolute: 0 K/uL (ref 0.0–0.5)
Eosinophils Relative: 0 %
HCT: 42.9 % (ref 39.0–52.0)
Hemoglobin: 14.7 g/dL (ref 13.0–17.0)
Immature Granulocytes: 0 %
Lymphocytes Relative: 13 %
Lymphs Abs: 1.5 K/uL (ref 0.7–4.0)
MCH: 30.8 pg (ref 26.0–34.0)
MCHC: 34.3 g/dL (ref 30.0–36.0)
MCV: 89.7 fL (ref 80.0–100.0)
Monocytes Absolute: 1.1 K/uL — ABNORMAL HIGH (ref 0.1–1.0)
Monocytes Relative: 10 %
Neutro Abs: 8.6 K/uL — ABNORMAL HIGH (ref 1.7–7.7)
Neutrophils Relative %: 77 %
Platelets: 210 K/uL (ref 150–400)
RBC: 4.78 MIL/uL (ref 4.22–5.81)
RDW: 13.4 % (ref 11.5–15.5)
WBC: 11.3 K/uL — ABNORMAL HIGH (ref 4.0–10.5)
nRBC: 0 % (ref 0.0–0.2)

## 2023-10-26 LAB — URINALYSIS, ROUTINE W REFLEX MICROSCOPIC
Bilirubin Urine: NEGATIVE
Glucose, UA: NEGATIVE mg/dL
Hgb urine dipstick: NEGATIVE
Ketones, ur: 20 mg/dL — AB
Leukocytes,Ua: NEGATIVE
Nitrite: NEGATIVE
Protein, ur: NEGATIVE mg/dL
Specific Gravity, Urine: 1.029 (ref 1.005–1.030)
pH: 5 (ref 5.0–8.0)

## 2023-10-26 LAB — COMPREHENSIVE METABOLIC PANEL WITH GFR
ALT: 25 U/L (ref 0–44)
AST: 24 U/L (ref 15–41)
Albumin: 4 g/dL (ref 3.5–5.0)
Alkaline Phosphatase: 44 U/L (ref 38–126)
Anion gap: 10 (ref 5–15)
BUN: 18 mg/dL (ref 6–20)
CO2: 23 mmol/L (ref 22–32)
Calcium: 9.5 mg/dL (ref 8.9–10.3)
Chloride: 106 mmol/L (ref 98–111)
Creatinine, Ser: 1.45 mg/dL — ABNORMAL HIGH (ref 0.61–1.24)
GFR, Estimated: 57 mL/min — ABNORMAL LOW (ref >60)
Glucose, Bld: 92 mg/dL (ref 70–99)
Potassium: 3.4 mmol/L — ABNORMAL LOW (ref 3.5–5.1)
Sodium: 139 mmol/L (ref 135–145)
Total Bilirubin: 0.7 mg/dL (ref 0.0–1.2)
Total Protein: 6.8 g/dL (ref 6.5–8.1)

## 2023-10-26 MED ORDER — ONDANSETRON HCL 4 MG/2ML IJ SOLN
4.0000 mg | Freq: Once | INTRAMUSCULAR | Status: AC
  Administered 2023-10-26: 4 mg via INTRAVENOUS
  Filled 2023-10-26: qty 2

## 2023-10-26 MED ORDER — KETOROLAC TROMETHAMINE 30 MG/ML IJ SOLN
30.0000 mg | Freq: Once | INTRAMUSCULAR | Status: AC
  Administered 2023-10-26: 30 mg via INTRAVENOUS
  Filled 2023-10-26: qty 1

## 2023-10-26 MED ORDER — HYDROMORPHONE HCL 4 MG PO TABS: 4.0000 mg | ORAL_TABLET | Freq: Four times a day (QID) | ORAL | 0 refills | Status: DC | PRN

## 2023-10-26 MED ORDER — HYDROMORPHONE HCL 1 MG/ML IJ SOLN
0.5000 mg | Freq: Once | INTRAMUSCULAR | Status: AC
  Administered 2023-10-26: 0.5 mg via INTRAVENOUS
  Filled 2023-10-26: qty 0.5

## 2023-10-26 NOTE — ED Provider Notes (Signed)
 [place] EMERGENCY DEPARTMENT AT [facility] Provider Note   CSN: [id number] Arrival date & time: 10/26/23  1759     Patient presents with: Flank Pain   Eugene Coleman is a 53 y.o. male.  {Add pertinent medical, surgical, social history, OB history to YEP:67052} Patient returns for the fourth time for flank pain.  Patient was diagnosed with a kidney stone on August 13.  He has a 5 mm proximal left ureteral stone   Flank Pain       Prior to Admission medications   Medication Sig Start Date End Date Taking? Authorizing Provider  HYDROmorphone  (DILAUDID ) 4 MG tablet Take 1 tablet (4 mg total) by mouth every 6 (six) hours as needed for severe pain (pain score 7-10). 10/26/23  Yes Suzette Pac, MD  amLODipine  (NORVASC ) 10 MG tablet TAKE 1 TABLET BY MOUTH EVERY DAY 03/11/23   Williamson, Joanna R, NP  hydrochlorothiazide  (HYDRODIURIL ) 25 MG tablet TAKE 1 TABLET (25 MG TOTAL) BY MOUTH DAILY. Patient not taking: Reported on 10/22/2023 03/11/23   Williamson, Joanna R, NP  METHYLENE BLUE PO Take by mouth.    [provider]  Multiple Vitamins-Minerals (MULTIVITAMIN WITH MINERALS) tablet Take 1 tablet by mouth daily.    [provider]  niacin (VITAMIN B3) 500 MG tablet Take 500 mg by mouth daily after breakfast.    [provider]  ondansetron  (ZOFRAN ) 4 MG tablet Take 1 tablet (4 mg total) by mouth every 6 (six) hours. As needed for nausea vomiting 10/22/23   Triplett, Tammy, PA-C  oxyCODONE -acetaminophen  (PERCOCET/ROXICET) 5-325 MG tablet Take 1 tablet by mouth every 4 (four) hours as needed. 10/22/23   Triplett, Tammy, PA-C  pantoprazole  (PROTONIX ) 20 MG tablet Take 1 tablet (20 mg total) by mouth daily. Patient needs follow up appointment for future refills. Please call [phone] to schedule an appointment. 06/25/23   Craig Alan SAUNDERS, PA-C  tamsulosin  (FLOMAX ) 0.4 MG CAPS capsule Take 1 capsule (0.4 mg total) by mouth daily. 10/22/23   Triplett,  Tammy, PA-C    Allergies: Lamictal [lamotrigine], Lisinopril , Paxil  [paroxetine  hcl], Zyprexa [olanzapine], and Penicillins    Review of Systems  Genitourinary:  Positive for flank pain.    Updated Vital Signs BP 137/84   Pulse 66   Temp 98.2 F (36.8 C) (Oral)   Resp 18   Ht 6' 3 (1.905 m)   Wt 111 kg   SpO2 94%   BMI 30.59 kg/m   Physical Exam  (all labs ordered are listed, but only abnormal results are displayed) Labs Reviewed  CBC WITH DIFFERENTIAL/PLATELET - Abnormal; Notable for the following components:      Result Value   WBC 11.3 (*)    Neutro Abs 8.6 (*)    Monocytes Absolute 1.1 (*)    All other components within normal limits  COMPREHENSIVE METABOLIC PANEL WITH GFR - Abnormal; Notable for the following components:   Potassium 3.4 (*)    Creatinine, Ser 1.45 (*)    GFR, Estimated 57 (*)    All other components within normal limits  URINALYSIS, ROUTINE W REFLEX MICROSCOPIC - Abnormal; Notable for the following components:   Color, Urine BLUE (*)    APPearance HAZY (*)    Ketones, ur 20 (*)    All other components within normal limits    EKG: None  Radiology: CT Renal Stone Study Result Date: 10/26/2023 CLINICAL DATA:  Left flank pain EXAM: CT ABDOMEN AND PELVIS WITHOUT CONTRAST TECHNIQUE: Multidetector CT imaging  of the abdomen and pelvis was performed following the standard protocol without IV contrast. RADIATION DOSE REDUCTION: This exam was performed according to the departmental dose-optimization program which includes automated exposure control, adjustment of the mA and/or kV according to patient size and/or use of iterative reconstruction technique. COMPARISON:  10/22/2023 FINDINGS: Lower chest: No acute abnormality Hepatobiliary: 2.7 cm cyst in the right hepatic dome. Prior cholecystectomy. No suspicious focal hepatic abnormality or biliary ductal dilatation. Pancreas: No focal abnormality or ductal dilatation. Spleen: No focal abnormality.  Normal  size. Adrenals/Urinary Tract: Adrenal glands normal. Mild left hydronephrosis due to 5 mm proximal left ureteral stone. 3 mm nonobstructing stone in the upper pole of the right kidney. No hydronephrosis on the right. Urinary bladder unremarkable. Stomach/Bowel: Normal appendix. Stomach, large and small bowel grossly unremarkable. Vascular/Lymphatic: No evidence of aneurysm or adenopathy. Reproductive: No visible focal abnormality. Other: No free fluid or free air. Musculoskeletal: No acute bony abnormality. IMPRESSION: 5 mm proximal left ureteral stone with mild left hydronephrosis. Right upper pole nonobstructing nephrolithiasis. Electronically Signed   By: Franky Crease M.D.   On: 10/26/2023 19:20    {Document cardiac monitor, telemetry assessment procedure when appropriate:32947} Procedures   Medications Ordered in the ED  HYDROmorphone  (DILAUDID ) injection 0.5 mg (has no administration in time range)  ketorolac  (TORADOL ) 30 MG/ML injection 30 mg (30 mg Intravenous Given 10/26/23 1920)  HYDROmorphone  (DILAUDID ) injection 0.5 mg (0.5 mg Intravenous Given 10/26/23 1920)  ondansetron  (ZOFRAN ) injection 4 mg (4 mg Intravenous Given 10/26/23 1919)  I spoke with Dr. Elisabeth about this patient and she felt like the patient could be discharged back home and to follow-up at [facility] urology in [place] in the next 2 to 3 days.    {Click here for ABCD2, HEART and other calculators REFRESH Note before signing:1}                              Medical Decision Making Amount and/or Complexity of Data Reviewed Labs: ordered. Radiology: ordered.  Risk Prescription drug management.   Moderate size left ureteral kidney stone.  Patient is prescribed Dilaudid  because Percocet was not working and he will continue the Flomax  and follow-up in the next couple days with urology  {Document critical care time when appropriate  Document review of labs and clinical decision tools ie CHADS2VASC2, etc  Document your  independent review of radiology images and any outside records  Document your discussion with family members, caretakers and with consultants  Document social determinants of health affecting pt's care  Document your decision making why or why not admission, treatments were needed:32947:::1}   Final diagnoses:  Kidney stone    ED Discharge Orders          Ordered    HYDROmorphone  (DILAUDID ) 4 MG tablet  Every 6 hours PRN        10/26/23 2122

## 2023-10-26 NOTE — ED Provider Notes (Signed)
 [place] EMERGENCY DEPARTMENT AT [facility] Provider Note   CSN: [id number] Arrival date & time: 10/25/23  1434     Patient presents with: Flank Pain   Eugene Coleman is a 53 y.o. male.   Patient comes in with left flank pain.  He has a known kidney stone.  The history is provided by the patient and medical records. No language interpreter was used.  Flank Pain This is a recurrent problem. The current episode started 6 to 12 hours ago. The problem occurs constantly. The problem has not changed since onset.Pertinent negatives include no chest pain, no abdominal pain and no headaches. Nothing aggravates the symptoms. Nothing relieves the symptoms.       Prior to Admission medications   Medication Sig Start Date End Date Taking? Authorizing Provider  amLODipine  (NORVASC ) 10 MG tablet TAKE 1 TABLET BY MOUTH EVERY DAY 03/11/23   Williamson, Joanna R, NP  hydrochlorothiazide  (HYDRODIURIL ) 25 MG tablet TAKE 1 TABLET (25 MG TOTAL) BY MOUTH DAILY. Patient not taking: Reported on 10/22/2023 03/11/23   Williamson, Joanna R, NP  METHYLENE BLUE PO Take by mouth.    [provider]  Multiple Vitamins-Minerals (MULTIVITAMIN WITH MINERALS) tablet Take 1 tablet by mouth daily.    [provider]  niacin (VITAMIN B3) 500 MG tablet Take 500 mg by mouth daily after breakfast.    [provider]  ondansetron  (ZOFRAN ) 4 MG tablet Take 1 tablet (4 mg total) by mouth every 6 (six) hours. As needed for nausea vomiting 10/22/23   Triplett, Tammy, PA-C  oxyCODONE -acetaminophen  (PERCOCET/ROXICET) 5-325 MG tablet Take 1 tablet by mouth every 4 (four) hours as needed. 10/22/23   Triplett, Tammy, PA-C  pantoprazole  (PROTONIX ) 20 MG tablet Take 1 tablet (20 mg total) by mouth daily. Patient needs follow up appointment for future refills. Please call [phone] to schedule an appointment. 06/25/23   Craig Alan SAUNDERS, PA-C  tamsulosin  (FLOMAX ) 0.4 MG CAPS capsule Take 1 capsule  (0.4 mg total) by mouth daily. 10/22/23   Triplett, Tammy, PA-C    Allergies: Lamictal [lamotrigine], Lisinopril , Paxil  [paroxetine  hcl], Zyprexa [olanzapine], and Penicillins    Review of Systems  Constitutional:  Negative for appetite change and fatigue.  HENT:  Negative for congestion, ear discharge and sinus pressure.   Eyes:  Negative for discharge.  Respiratory:  Negative for cough.   Cardiovascular:  Negative for chest pain.  Gastrointestinal:  Negative for abdominal pain and diarrhea.  Genitourinary:  Positive for flank pain. Negative for frequency and hematuria.  Musculoskeletal:  Negative for back pain.  Skin:  Negative for rash.  Neurological:  Negative for seizures and headaches.  Psychiatric/Behavioral:  Negative for hallucinations.     Updated Vital Signs BP (!) 141/96   Pulse 75   Temp 98.2 F (36.8 C) (Oral)   Ht 6' 3 (1.905 m)   Wt 111 kg   SpO2 98%   BMI 30.59 kg/m   Physical Exam Vitals and nursing note reviewed.  Constitutional:      Appearance: He is well-developed.  HENT:     Head: Normocephalic.     Nose: Nose normal.  Eyes:     General: No scleral icterus.    Conjunctiva/sclera: Conjunctivae normal.  Neck:     Thyroid : No thyromegaly.  Cardiovascular:     Rate and Rhythm: Normal rate and regular rhythm.     Heart sounds: No murmur heard.    No friction rub. No gallop.  Pulmonary:  Breath sounds: No stridor. No wheezing or rales.  Chest:     Chest wall: No tenderness.  Abdominal:     General: There is no distension.     Tenderness: There is no abdominal tenderness. There is no rebound.  Genitourinary:    Comments: Tender left flank Musculoskeletal:        General: Normal range of motion.     Cervical back: Neck supple.  Lymphadenopathy:     Cervical: No cervical adenopathy.  Skin:    Findings: No erythema or rash.  Neurological:     Mental Status: He is alert and oriented to person, place, and time.     Motor: No abnormal  muscle tone.     Coordination: Coordination normal.  Psychiatric:        Behavior: Behavior normal.     (all labs ordered are listed, but only abnormal results are displayed) Labs Reviewed  CBC WITH DIFFERENTIAL/PLATELET - Abnormal; Notable for the following components:      Result Value   WBC 10.7 (*)    Neutro Abs 8.2 (*)    All other components within normal limits  BASIC METABOLIC PANEL WITH GFR - Abnormal; Notable for the following components:   Glucose, Bld 107 (*)    All other components within normal limits    EKG: None  Radiology: No results found.   Procedures   Medications Ordered in the ED - No data to display                                  Medical Decision Making Amount and/or Complexity of Data Reviewed Labs: ordered.   Patient with flank pain from kidney stone.  Patient was given Toradol  and he left AMA before we able to reexamine him     Final diagnoses:  None    ED Discharge Orders     None          Suzette Pac, MD 10/26/23 1048

## 2023-10-26 NOTE — ED Triage Notes (Signed)
 Pt returns for left side flank pain and confirmed kidney stone.

## 2023-10-26 NOTE — Discharge Instructions (Signed)
 Call [facility] at [phone] tomorrow to get an appointment in the next couple days.  Tell them that the emergency physician spoke with Dr. Elisabeth and she stated that you need to be seen in the next couple days.  If you get worse before that time you should go to [facility] emergency department in [place].

## 2023-10-27 ENCOUNTER — Telehealth: Payer: Self-pay

## 2023-10-27 NOTE — Transitions of Care (Post Inpatient/ED Visit) (Signed)
   10/27/2023  Name: [name] MRN: [id number] DOB: March 24, 1970  Today's TOC FU Call Status: Today's TOC FU Call Status:: Successful TOC FU Call Completed TOC FU Call Complete Date: 10/27/23 Patient's Name and Date of Birth confirmed.  Transition Care Management Follow-up Telephone Call [name] Type of Discharge: Emergency Department Reason for ED Visit: Other: (flank pain, kidney stone.) How have you been since you were released from the hospital?: Better (with medicine) Any questions or concerns?: No  Items Reviewed: Did you receive and understand the discharge instructions provided?: Yes Medications obtained,verified, and reconciled?: Yes (Medications Reviewed)  Medications Reviewed Today: Medications Reviewed Today     Reviewed by Tawney Vanorman, CMA (Certified Medical Assistant) on 10/27/23 at 1150  Med List Status: <None>   Medication Order Taking? Sig Documenting Provider Last Dose Status Informant  amLODipine  (NORVASC ) 10 MG tablet [id number] Yes TAKE 1 TABLET BY MOUTH EVERY DAY Williamson, Joanna R, NP  Active Self, Pharmacy Records  hydrochlorothiazide  (HYDRODIURIL ) 25 MG tablet [id number]  TAKE 1 TABLET (25 MG TOTAL) BY MOUTH DAILY.  Patient not taking: Reported on 10/27/2023   Billy Philippe SAUNDERS, NP  Active Self, Pharmacy Records  HYDROmorphone  (DILAUDID ) 4 MG tablet [id number]  Take 1 tablet (4 mg total) by mouth every 6 (six) hours as needed for severe pain (pain score 7-10).  Patient not taking: Reported on 10/27/2023   Zammit, Joseph, MD  Active   METHYLENE BLUE PO [id number] Yes Take by mouth. [provider]  Active Self, Pharmacy Records  Multiple Vitamins-Minerals (MULTIVITAMIN WITH MINERALS) tablet [id number] Yes Take 1 tablet by mouth daily. [provider]  Active Self, Pharmacy Records  niacin (VITAMIN B3) 500 MG tablet [id number] Yes Take 500 mg by mouth daily after breakfast. [provider]  Active  Self, Pharmacy Records  ondansetron  (ZOFRAN ) 4 MG tablet [id number] Yes Take 1 tablet (4 mg total) by mouth every 6 (six) hours. As needed for nausea vomiting Triplett, Tammy, PA-C  Active   oxyCODONE -acetaminophen  (PERCOCET/ROXICET) 5-325 MG tablet [id number] Yes Take 1 tablet by mouth every 4 (four) hours as needed. Triplett, Tammy, PA-C  Active   pantoprazole  (PROTONIX ) 20 MG tablet [id number] Yes Take 1 tablet (20 mg total) by mouth daily. Patient needs follow up appointment for future refills. Please call [phone] to schedule an appointment. Craig Alan SAUNDERS, PA-C  Active Self, Pharmacy Records  tamsulosin  (FLOMAX ) 0.4 MG CAPS capsule [id number] Yes Take 1 capsule (0.4 mg total) by mouth daily. Herlinda Milling, PA-C  Active             Home Care and Equipment/Supplies:    Functional Questionnaire:    Follow up appointments reviewed: PCP Follow-up appointment confirmed?: NA Specialist Hospital Follow-up appointment confirmed?: Yes Date of Specialist follow-up appointment?: 11/03/23 Follow-Up Specialty Provider:: urology    SIGNATURE: Willeen Craver, CMA

## 2023-10-31 ENCOUNTER — Other Ambulatory Visit: Payer: Self-pay | Admitting: Urology

## 2023-10-31 DIAGNOSIS — N200 Calculus of kidney: Secondary | ICD-10-CM

## 2023-10-31 NOTE — Progress Notes (Unsigned)
 11/03/2023 4:32 PM   Eugene Coleman 02-Jan-1971 [id number]  Referring provider: No referring provider defined for this encounter.  Urological history: 1.  Undesired fertility - Vasectomy (2020)   Chief Complaint  Patient presents with   Establish Care    ED follow for Kidney Stone    HPI: Eugene Coleman is a 53 y.o. man who presents today for nephrolithiasis.    Previous records reviewed.   He presented to the [facility] health emergency department on [name] on October 22, 2023 with the sudden onset of dark-colored urine and left flank and left lower quadrant pain.   CT renal stone study performed that day noted a 5 mm left UPJ stone with minimal left-sided hydronephrosis.  CBC was normal.  UA was positive for greater than 50 RBCs.   Serum creatinine of 0.89, with a EGFR greater than 60.  He was discharged on Zofran  4 mg every 6 hours, Percocet 5/325 every 4 hours and tamsulosin  0.4 mg daily.    He went back to [facility] health emergency department in [place] on October 23, 2023 for uncontrolled pain.   Serum creatinine was up at 1.52, EGFR was 54.  CBC with leukocytosis with WBC count of 14.3.    He was given Toradol , Dilaudid  and Zofran .   He went back to the [facility] health emergency department in [facility] emergency department on October 25, 2023 for uncontrolled pain.  He was given Toradol  and left AMA before he could be examined.  He then returned on October 26, 2023 to the [facility] health emergency department at [facility] for left-sided flank pain.   Repeated CT renal stone study remained unchanged.  CBC with improved leukocytosis with WBC dropping from serum creatinine also improving from 1.52-1.45 with EGFR 57.  3    He has been pain free for the last week, but he has not passed any fragments.  Patient denies any modifying or aggravating factors.  Patient denies any recent UTI's, gross hematuria, dysuria or suprapubic/flank pain.  Patient denies any fevers, chills, nausea or  vomiting.    KUB 7 mm calcification at the level of L3 which correlates to the stone seen on CT, two punctate calcifications seen in the right kidney, radiologist interpretation still pending.   UA positive for moderate bilirubin.  CMP on 10/26/2023 showed normal LFT's.   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    Surgical History: Past Surgical History:  Procedure Laterality Date   COLONOSCOPY WITH PROPOFOL  N/A 04/04/2022   Procedure: COLONOSCOPY WITH PROPOFOL ;  Surgeon: Aneita Gwendlyn DASEN, MD;  Location: [facility];  Service: Gastroenterology;  Laterality: N/A;   ESOPHAGEAL DILATION  04/04/2022   Procedure: ESOPHAGEAL DILATION;  Surgeon: Aneita Gwendlyn DASEN, MD;  Location: [facility];  Service: Gastroenterology;;   ESOPHAGOGASTRODUODENOSCOPY (EGD) WITH PROPOFOL  N/A 04/04/2022   Procedure: ESOPHAGOGASTRODUODENOSCOPY (EGD) WITH  PROPOFOL ;  Surgeon: Aneita Gwendlyn DASEN, MD;  Location: [facility];  Service: Gastroenterology;  Laterality: N/A;   HERNIA REPAIR     HOT HEMOSTASIS N/A 04/04/2022   Procedure: HOT HEMOSTASIS (ARGON PLASMA COAGULATION/BICAP);  Surgeon: Aneita Gwendlyn DASEN, MD;  Location: [facility];  Service: Gastroenterology;  Laterality: N/A;   INGUINAL HERNIA REPAIR Bilateral    with Vasectomy    KNEE SURGERY Bilateral [phone]   left x 2, right x 1   POLYPECTOMY  04/04/2022    Procedure: POLYPECTOMY;  Surgeon: Aneita Gwendlyn DASEN, MD;  Location: [facility];  Service: Gastroenterology;;   TONSILLECTOMY     VASECTOMY     WISDOM TOOTH EXTRACTION     childhood    Home Medications:  Allergies as of 11/03/2023       Reactions   Lamictal [lamotrigine] Other (See Comments)   Significant gene-drug interaction (genetic testing, has never taken this medication)   Lisinopril  Hives   No angioedema symptoms per patient   Paxil  [paroxetine  Hcl] Other (See Comments)   Significant gene-drug interaction   Zyprexa [olanzapine] Other (See Comments)   Significant gene-drug interaction (genetic testing, has never taken this medication)   Penicillins Rash   Was allergic to it as a child Did it involve swelling of the face/tongue/throat, SOB, or low BP? No Did it involve sudden or severe rash/hives, skin peeling, or any reaction on the inside of your mouth or nose? No Did you need to seek medical attention at a hospital or doctor's office? No When did it last happen?      Childhood If all above answers are "NO", may proceed with cephalosporin use.        Medication List        Accurate as of November 03, 2023 11:59 PM. If you have any questions, ask your nurse or doctor.          STOP taking these medications    hydrochlorothiazide  25 MG tablet Commonly known as: HYDRODIURIL  Stopped by: Abrham Maslowski       TAKE these medications    amLODipine  10 MG tablet Commonly known as: NORVASC  TAKE 1 TABLET BY MOUTH EVERY DAY   HYDROmorphone  4 MG tablet Commonly known as: Dilaudid  Take 1 tablet (4 mg total) by mouth every 6 (six) hours as needed for severe pain (pain score 7-10).   METHYLENE BLUE PO Take by mouth.   multivitamin with minerals tablet Take 1 tablet by mouth daily.   niacin 500 MG tablet Commonly known as: VITAMIN B3 Take 500 mg by mouth daily after breakfast.   ondansetron  4 MG tablet Commonly known as: ZOFRAN  Take 1 tablet (4 mg total) by  mouth every 6 (six) hours. As needed for nausea vomiting   oxyCODONE -acetaminophen  5-325 MG tablet Commonly known as: PERCOCET/ROXICET Take 1 tablet by mouth every 4 (four) hours as needed.   pantoprazole  20 MG tablet Commonly known as: Protonix  Take 1 tablet (20 mg total) by mouth daily. Patient needs follow up appointment for future refills. Please call [phone] to schedule an appointment.   tamsulosin  0.4 MG Caps capsule Commonly known as: FLOMAX  Take 1 capsule (0.4 mg total) by mouth daily.        Allergies:  Allergies  Allergen Reactions   Lamictal [Lamotrigine] Other (See Comments)    Significant gene-drug interaction (genetic testing, has never taken this medication)   Lisinopril  Hives    No angioedema symptoms per patient   Paxil  [Paroxetine  Hcl] Other (See Comments)  Significant gene-drug interaction   Zyprexa [Olanzapine] Other (See Comments)    Significant gene-drug interaction (genetic testing, has never taken this medication)   Penicillins Rash    Was allergic to it as a child  Did it involve swelling of the face/tongue/throat, SOB, or low BP? No Did it involve sudden or severe rash/hives, skin peeling, or any reaction on the inside of your mouth or nose? No Did you need to seek medical attention at a hospital or doctor's office? No When did it last happen?      Childhood If all above answers are "NO", may proceed with cephalosporin use.    Family History: Family History  Problem Relation Age of Onset   Breast cancer Mother    Diverticulitis Mother    Leukemia Father    Hypertension Father    Lupus Sister    Thyroid  disease Brother        Part of thyroid  has been removed   Heart attack Maternal Grandfather    Thyroid  disease Paternal Grandmother    Heart attack Paternal Grandfather    Stroke Paternal Grandfather    Colon cancer Neg Hx    Colon polyps Neg Hx    Esophageal cancer Neg Hx    Rectal cancer Neg Hx     Social History: See HPI  for pertinent social history  ROS: Pertinent ROS in HPI  Physical Exam: BP 134/84   Pulse 69   Ht 6' 3 (1.905 m)   Wt 240 lb (108.9 kg)   BMI 30.00 kg/m   Constitutional:  Well nourished. Alert and oriented, No acute distress. HEENT: [place] AT, moist mucus membranes.  Trachea midline Cardiovascular: No clubbing, cyanosis, or edema. Respiratory: Normal respiratory effort, no increased work of breathing. Neurologic: Grossly intact, no focal deficits, moving all 4 extremities. Psychiatric: Normal mood and affect.  Laboratory Data: See EPIC and HPI  I have reviewed the labs.   Pertinent Imaging: CLINICAL DATA:  Abdominal/flank pain, stone suspected left flank pain   EXAM: CT ABDOMEN AND PELVIS WITHOUT CONTRAST   TECHNIQUE: Multidetector CT imaging of the abdomen and pelvis was performed following the standard protocol without IV contrast.   RADIATION DOSE REDUCTION: This exam was performed according to the departmental dose-optimization program which includes automated exposure control, adjustment of the mA and/or kV according to patient size and/or use of iterative reconstruction technique.   COMPARISON:  April 02, 2017   FINDINGS: Of note, the lack of intravenous contrast limits evaluation of the solid organ parenchyma and vascularity.   Lower chest: No focal airspace consolidation or pleural effusion.   Hepatobiliary: Unchanged 2.5 cm cyst in the right hepatic lobe.Cholecystectomy. No intrahepatic or extrahepatic biliary ductal dilation.   Pancreas: No mass or main ductal dilation. No peripancreatic inflammation or fluid collection.   Spleen: Normal size. No mass.   Adrenals/Urinary Tract: No adrenal masses. No renal mass. Punctate nonobstructive right upper pole calculus. Minimal left-sided hydronephrosis due to an obstructive 3 x 3 x 5 mm calculus at the left UPJ. The urinary bladder is distended without focal abnormality.   Stomach/Bowel: The stomach is  decompressed without focal abnormality. No small bowel wall thickening or inflammation. No small bowel obstruction.Normal appendix.   Vascular/Lymphatic: No aortic aneurysm. No intraabdominal or pelvic lymphadenopathy.   Reproductive: No prostatomegaly. No free pelvic fluid.   Other: No pneumoperitoneum or ascites.   Musculoskeletal: No acute fracture or destructive lesion. Multilevel degenerative disc disease of the spine. Small fat containing epigastric ventral abdominal  hernia. No findings of vascular compromise.   IMPRESSION: Minimal left-sided hydronephrosis due to an obstructive calculus measuring 3 x 3 x 5 mm at the left UPJ.     Electronically Signed   By: Rogelia Myers M.D.   On: 10/22/2023 18:33   CLINICAL DATA:  Left flank pain   EXAM: CT ABDOMEN AND PELVIS WITHOUT CONTRAST   TECHNIQUE: Multidetector CT imaging of the abdomen and pelvis was performed following the standard protocol without IV contrast.   RADIATION DOSE REDUCTION: This exam was performed according to the departmental dose-optimization program which includes automated exposure control, adjustment of the mA and/or kV according to patient size and/or use of iterative reconstruction technique.   COMPARISON:  10/22/2023   FINDINGS: Lower chest: No acute abnormality   Hepatobiliary: 2.7 cm cyst in the right hepatic dome. Prior cholecystectomy. No suspicious focal hepatic abnormality or biliary ductal dilatation.   Pancreas: No focal abnormality or ductal dilatation.   Spleen: No focal abnormality.  Normal size.   Adrenals/Urinary Tract: Adrenal glands normal. Mild left hydronephrosis due to 5 mm proximal left ureteral stone. 3 mm nonobstructing stone in the upper pole of the right kidney. No hydronephrosis on the right. Urinary bladder unremarkable.   Stomach/Bowel: Normal appendix. Stomach, large and small bowel grossly unremarkable.   Vascular/Lymphatic: No evidence of aneurysm or  adenopathy.   Reproductive: No visible focal abnormality.   Other: No free fluid or free air.   Musculoskeletal: No acute bony abnormality.   IMPRESSION: 5 mm proximal left ureteral stone with mild left hydronephrosis.   Right upper pole nonobstructing nephrolithiasis.     Electronically Signed   By: Franky Crease M.D.   On: 10/26/2023 19:20 I have independently reviewed the films.  See HPI.  Assessment & Plan:    1. Left ureteral stone - UA moderate bilirubin, LFT's normal 10/26/2023 - urine culture is pending - KUB 7 mm left ureteral stone -he has failed MET - We discussed various treatment options for urolithiasis including observation with or without medical expulsive therapy, shockwave lithotripsy (SWL), ureteroscopy and laser lithotripsy with stent placement and given stone size, stone location and stone density, both are treatment options for them   -explained that ESWL has a lower stone-free rate in a single procedure, but also a lower complication rate compared to ureteroscopy, and avoids a stent and associated stent related symptoms. Possible complications include renal hematoma, steinstrasse, and the need for additional treatment. -explained that ureteroscopy with laser lithotripsy and stent placement has a higher stone-free rate than SWL in a single procedure; however, there is an increased complication rate, including possible infection, ureteral injury, bleeding, and stent-related morbidity. Common stent-related symptoms include dysuria, urgency/frequency, and flank pain - he would like to pursue ESWL - Explained to the patient that with ESWL, shock waves are focused on the stone using X-rays to pinpoint the stone.  The shock waves are fired repeatedly which usually causes the stone to break into small pieces which pass out in the urine over the next few weeks.  They will go home that day and may be able to resume normal activities in three days.  They should be given a  strainer and they need to collect any fragments/sediments that they pass for analysis.   Risks involved with the procedure consist of bruising of the skin and kidney, possible long-term kidney damage, development of HTN, damage to the bowel, spleen, liver, pancreas, male organs or lungs, hematuria, hematuria serious enough to require transfusion  or surgical repair, formation of a hematoma, infection or sepsis.  If the stone is too large or dense, it may not break apart or break apart in large pieces and cause Steinstrasse.  If this happens, it would result in the need for another procedure (likely URS/LL/stent placement).  IV sedation is typically used, but in rare instances general anesthesia is used with the risk of irregular heart beat, irregular BP, stroke, MI, CVA, paralysis, coma and/or death.   - he stated he would return to the ED if he has pain uncontrolled by pain meds or develops fevers or intractable vomiting   - Patient's insurance would not cover ESWL, so he scheduled for left ureteroscopy on Thursday - schedule left ureteroscopy with laser lithotripsy and ureteral stent placement - explained to the patient how the procedure is performed and the risks involved -informed the patient that they will have an ureteral stent, which will remain in place for approximately 3-10 days and can be associated with flank pain, bladder pain, dysuria, urgency, frequency, urinary leakage, and gross hematuria. - stent may be removed in the office with a cystoscope or patient may be instructed to remove the stent themselves by the string and that is decided on the day of the procedure - residual stones within the kidney or ureter may be present after the procedure and may need to have these addressed at a different encounter - injury to the ureter is the most common intra-operative risk, it may result in an open procedure to correct the defect - infection and bleeding are also risks - explained the risks of  general anesthesia, such as: MI, CVA, paralysis, coma and/or death. - advised to contact our office or seek treatment in the ED if becomes febrile or pain/ vomiting are difficult control in order to arrange for emergent/urgent intervention    2. Microscopic hematuria   - UA w/o micro heme   3. Right renal stone -explained that this stone will not be addressed at this time -explained that we cannot predict when or if it will cause issues in the future -explained that because of its size, it will have an 80% spontaneous passage rate -recommend KUB in one year to evaluate for change in size or migration -reviewed return to clinic signs (intense right flank pain, gross heme, fevers/chills or vomiting)    4. Elevated urine bilirubin - CMP on 10/26/2023, so likely a false positive on urine dip  - results available after patient left the office, will notify by phone and repeat the UA   Return for Left URS/LL/ureteral stent placement.  These notes generated with voice recognition software. I apologize for typographical errors.  Eugene Coleman  [facility] [address]  [address] [place], [place] [address] [phone]    ESWL ORDER FORM  Expected date of procedure: 11/06/2023  Surgeon: Glendia Barba, MD  Post op standing: 2-4wk follow up w/KUB prior  Anticoagulation/Aspirin/NSAID standing order: Hold all 72 hours prior  Anesthesia standing order: MAC  VTE standing: SCD's  Dx: Left Ureteral Stone  Procedure: left Extracorporeal shock wave lithotripsy  CPT : 50590  Standing Order Set:   *NPO after mn, KUB  *NS 135ml/hr, Cipro 500mg  PO, Benadryl 25mg  PO, Valium 10mg  PO, Zofran  4mg  IV  Medications if other than standing orders:   NONE

## 2023-10-31 NOTE — H&P (View-Only) (Signed)
 11/03/2023 4:32 PM   Eugene Coleman January 30, 1971 [id number]  Referring provider: No referring provider defined for this encounter.  Urological history: 1.  Undesired fertility - Vasectomy (2020)   Chief Complaint  Patient presents with   Establish Care    ED follow for Kidney Stone    HPI: Eugene Coleman is a 53 y.o. man who presents today for nephrolithiasis.    Previous records reviewed.   He presented to the [facility] health emergency department on Eugene Coleman on October 22, 2023 with the sudden onset of dark-colored urine and left flank and left lower quadrant pain.   CT renal stone study performed that day noted a 5 mm left UPJ stone with minimal left-sided hydronephrosis.  CBC was normal.  UA was positive for greater than 50 RBCs.   Serum creatinine of 0.89, with a EGFR greater than 60.  He was discharged on Zofran  4 mg every 6 hours, Percocet 5/325 every 4 hours and tamsulosin  0.4 mg daily.    He went back to [facility] health emergency department in [place] on October 23, 2023 for uncontrolled pain.   Serum creatinine was up at 1.52, EGFR was 54.  CBC with leukocytosis with WBC count of 14.3.    He was given Toradol , Dilaudid  and Zofran .   He went back to the [facility] health emergency department in [facility] emergency department on October 25, 2023 for uncontrolled pain.  He was given Toradol  and left AMA before he could be examined.  He then returned on October 26, 2023 to the [facility] health emergency department at [facility] for left-sided flank pain.   Repeated CT renal stone study remained unchanged.  CBC with improved leukocytosis with WBC dropping from serum creatinine also improving from 1.52-1.45 with EGFR 57.  3    He has been pain free for the last week, but he has not passed any fragments.  Patient denies any modifying or aggravating factors.  Patient denies any recent UTI's, gross hematuria, dysuria or suprapubic/flank pain.  Patient denies any fevers, chills, nausea or  vomiting.    KUB 7 mm calcification at the level of L3 which correlates to the stone seen on CT, two punctate calcifications seen in the right kidney, radiologist interpretation still pending.   UA positive for moderate bilirubin.  CMP on 10/26/2023 showed normal LFT's.   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    Surgical History: Past Surgical History:  Procedure Laterality Date   COLONOSCOPY WITH PROPOFOL  N/A 04/04/2022   Procedure: COLONOSCOPY WITH PROPOFOL ;  Surgeon: Aneita Gwendlyn DASEN, MD;  Location: [facility];  Service: Gastroenterology;  Laterality: N/A;   ESOPHAGEAL DILATION  04/04/2022   Procedure: ESOPHAGEAL DILATION;  Surgeon: Aneita Gwendlyn DASEN, MD;  Location: [facility];  Service: Gastroenterology;;   ESOPHAGOGASTRODUODENOSCOPY (EGD) WITH PROPOFOL  N/A 04/04/2022   Procedure: ESOPHAGOGASTRODUODENOSCOPY (EGD) WITH  PROPOFOL ;  Surgeon: Aneita Gwendlyn DASEN, MD;  Location: [facility];  Service: Gastroenterology;  Laterality: N/A;   HERNIA REPAIR     HOT HEMOSTASIS N/A 04/04/2022   Procedure: HOT HEMOSTASIS (ARGON PLASMA COAGULATION/BICAP);  Surgeon: Aneita Gwendlyn DASEN, MD;  Location: [facility];  Service: Gastroenterology;  Laterality: N/A;   INGUINAL HERNIA REPAIR Bilateral    with Vasectomy    KNEE SURGERY Bilateral [phone]   left x 2, right x 1   POLYPECTOMY  04/04/2022    Procedure: POLYPECTOMY;  Surgeon: Aneita Gwendlyn DASEN, MD;  Location: [facility];  Service: Gastroenterology;;   TONSILLECTOMY     VASECTOMY     WISDOM TOOTH EXTRACTION     childhood    Home Medications:  Allergies as of 11/03/2023       Reactions   Lamictal [lamotrigine] Other (See Comments)   Significant gene-drug interaction (genetic testing, has never taken this medication)   Lisinopril  Hives   No angioedema symptoms per patient   Paxil  [paroxetine  Hcl] Other (See Comments)   Significant gene-drug interaction   Zyprexa [olanzapine] Other (See Comments)   Significant gene-drug interaction (genetic testing, has never taken this medication)   Penicillins Rash   Was allergic to it as a child Did it involve swelling of the face/tongue/throat, SOB, or low BP? No Did it involve sudden or severe rash/hives, skin peeling, or any reaction on the inside of your mouth or nose? No Did you need to seek medical attention at a hospital or doctor's office? No When did it last happen?      Childhood If all above answers are "NO", may proceed with cephalosporin use.        Medication List        Accurate as of November 03, 2023 11:59 PM. If you have any questions, ask your nurse or doctor.          STOP taking these medications    hydrochlorothiazide  25 MG tablet Commonly known as: HYDRODIURIL  Stopped by: Jabe Jeanbaptiste       TAKE these medications    amLODipine  10 MG tablet Commonly known as: NORVASC  TAKE 1 TABLET BY MOUTH EVERY DAY   HYDROmorphone  4 MG tablet Commonly known as: Dilaudid  Take 1 tablet (4 mg total) by mouth every 6 (six) hours as needed for severe pain (pain score 7-10).   METHYLENE BLUE PO Take by mouth.   multivitamin with minerals tablet Take 1 tablet by mouth daily.   niacin 500 MG tablet Commonly known as: VITAMIN B3 Take 500 mg by mouth daily after breakfast.   ondansetron  4 MG tablet Commonly known as: ZOFRAN  Take 1 tablet (4 mg total) by  mouth every 6 (six) hours. As needed for nausea vomiting   oxyCODONE -acetaminophen  5-325 MG tablet Commonly known as: PERCOCET/ROXICET Take 1 tablet by mouth every 4 (four) hours as needed.   pantoprazole  20 MG tablet Commonly known as: Protonix  Take 1 tablet (20 mg total) by mouth daily. Patient needs follow up appointment for future refills. Please call [phone] to schedule an appointment.   tamsulosin  0.4 MG Caps capsule Commonly known as: FLOMAX  Take 1 capsule (0.4 mg total) by mouth daily.        Allergies:  Allergies  Allergen Reactions   Lamictal [Lamotrigine] Other (See Comments)    Significant gene-drug interaction (genetic testing, has never taken this medication)   Lisinopril  Hives    No angioedema symptoms per patient   Paxil  [Paroxetine  Hcl] Other (See Comments)  Significant gene-drug interaction   Zyprexa [Olanzapine] Other (See Comments)    Significant gene-drug interaction (genetic testing, has never taken this medication)   Penicillins Rash    Was allergic to it as a child  Did it involve swelling of the face/tongue/throat, SOB, or low BP? No Did it involve sudden or severe rash/hives, skin peeling, or any reaction on the inside of your mouth or nose? No Did you need to seek medical attention at a hospital or doctor's office? No When did it last happen?      Childhood If all above answers are "NO", may proceed with cephalosporin use.    Family History: Family History  Problem Relation Age of Onset   Breast cancer Mother    Diverticulitis Mother    Leukemia Father    Hypertension Father    Lupus Sister    Thyroid  disease Brother        Part of thyroid  has been removed   Heart attack Maternal Grandfather    Thyroid  disease Paternal Grandmother    Heart attack Paternal Grandfather    Stroke Paternal Grandfather    Colon cancer Neg Hx    Colon polyps Neg Hx    Esophageal cancer Neg Hx    Rectal cancer Neg Hx     Social History: See HPI  for pertinent social history  ROS: Pertinent ROS in HPI  Physical Exam: BP 134/84   Pulse 69   Ht 6' 3 (1.905 m)   Wt 240 lb (108.9 kg)   BMI 30.00 kg/m   Constitutional:  Well nourished. Alert and oriented, No acute distress. HEENT: [place] AT, moist mucus membranes.  Trachea midline Cardiovascular: No clubbing, cyanosis, or edema. Respiratory: Normal respiratory effort, no increased work of breathing. Neurologic: Grossly intact, no focal deficits, moving all 4 extremities. Psychiatric: Normal mood and affect.  Laboratory Data: See EPIC and HPI  I have reviewed the labs.   Pertinent Imaging: CLINICAL DATA:  Abdominal/flank pain, stone suspected left flank pain   EXAM: CT ABDOMEN AND PELVIS WITHOUT CONTRAST   TECHNIQUE: Multidetector CT imaging of the abdomen and pelvis was performed following the standard protocol without IV contrast.   RADIATION DOSE REDUCTION: This exam was performed according to the departmental dose-optimization program which includes automated exposure control, adjustment of the mA and/or kV according to patient size and/or use of iterative reconstruction technique.   COMPARISON:  April 02, 2017   FINDINGS: Of note, the lack of intravenous contrast limits evaluation of the solid organ parenchyma and vascularity.   Lower chest: No focal airspace consolidation or pleural effusion.   Hepatobiliary: Unchanged 2.5 cm cyst in the right hepatic lobe.Cholecystectomy. No intrahepatic or extrahepatic biliary ductal dilation.   Pancreas: No mass or main ductal dilation. No peripancreatic inflammation or fluid collection.   Spleen: Normal size. No mass.   Adrenals/Urinary Tract: No adrenal masses. No renal mass. Punctate nonobstructive right upper pole calculus. Minimal left-sided hydronephrosis due to an obstructive 3 x 3 x 5 mm calculus at the left UPJ. The urinary bladder is distended without focal abnormality.   Stomach/Bowel: The stomach is  decompressed without focal abnormality. No small bowel wall thickening or inflammation. No small bowel obstruction.Normal appendix.   Vascular/Lymphatic: No aortic aneurysm. No intraabdominal or pelvic lymphadenopathy.   Reproductive: No prostatomegaly. No free pelvic fluid.   Other: No pneumoperitoneum or ascites.   Musculoskeletal: No acute fracture or destructive lesion. Multilevel degenerative disc disease of the spine. Small fat containing epigastric ventral abdominal  hernia. No findings of vascular compromise.   IMPRESSION: Minimal left-sided hydronephrosis due to an obstructive calculus measuring 3 x 3 x 5 mm at the left UPJ.     Electronically Signed   By: Rogelia Myers M.D.   On: 10/22/2023 18:33   CLINICAL DATA:  Left flank pain   EXAM: CT ABDOMEN AND PELVIS WITHOUT CONTRAST   TECHNIQUE: Multidetector CT imaging of the abdomen and pelvis was performed following the standard protocol without IV contrast.   RADIATION DOSE REDUCTION: This exam was performed according to the departmental dose-optimization program which includes automated exposure control, adjustment of the mA and/or kV according to patient size and/or use of iterative reconstruction technique.   COMPARISON:  10/22/2023   FINDINGS: Lower chest: No acute abnormality   Hepatobiliary: 2.7 cm cyst in the right hepatic dome. Prior cholecystectomy. No suspicious focal hepatic abnormality or biliary ductal dilatation.   Pancreas: No focal abnormality or ductal dilatation.   Spleen: No focal abnormality.  Normal size.   Adrenals/Urinary Tract: Adrenal glands normal. Mild left hydronephrosis due to 5 mm proximal left ureteral stone. 3 mm nonobstructing stone in the upper pole of the right kidney. No hydronephrosis on the right. Urinary bladder unremarkable.   Stomach/Bowel: Normal appendix. Stomach, large and small bowel grossly unremarkable.   Vascular/Lymphatic: No evidence of aneurysm or  adenopathy.   Reproductive: No visible focal abnormality.   Other: No free fluid or free air.   Musculoskeletal: No acute bony abnormality.   IMPRESSION: 5 mm proximal left ureteral stone with mild left hydronephrosis.   Right upper pole nonobstructing nephrolithiasis.     Electronically Signed   By: Franky Crease M.D.   On: 10/26/2023 19:20 I have independently reviewed the films.  See HPI.  Assessment & Plan:    1. Left ureteral stone - UA moderate bilirubin, LFT's normal 10/26/2023 - urine culture is pending - KUB 7 mm left ureteral stone -he has failed MET - We discussed various treatment options for urolithiasis including observation with or without medical expulsive therapy, shockwave lithotripsy (SWL), ureteroscopy and laser lithotripsy with stent placement and given stone size, stone location and stone density, both are treatment options for them   -explained that ESWL has a lower stone-free rate in a single procedure, but also a lower complication rate compared to ureteroscopy, and avoids a stent and associated stent related symptoms. Possible complications include renal hematoma, steinstrasse, and the need for additional treatment. -explained that ureteroscopy with laser lithotripsy and stent placement has a higher stone-free rate than SWL in a single procedure; however, there is an increased complication rate, including possible infection, ureteral injury, bleeding, and stent-related morbidity. Common stent-related symptoms include dysuria, urgency/frequency, and flank pain - he would like to pursue ESWL - Explained to the patient that with ESWL, shock waves are focused on the stone using X-rays to pinpoint the stone.  The shock waves are fired repeatedly which usually causes the stone to break into small pieces which pass out in the urine over the next few weeks.  They will go home that day and may be able to resume normal activities in three days.  They should be given a  strainer and they need to collect any fragments/sediments that they pass for analysis.   Risks involved with the procedure consist of bruising of the skin and kidney, possible long-term kidney damage, development of HTN, damage to the bowel, spleen, liver, pancreas, male organs or lungs, hematuria, hematuria serious enough to require transfusion  or surgical repair, formation of a hematoma, infection or sepsis.  If the stone is too large or dense, it may not break apart or break apart in large pieces and cause Steinstrasse.  If this happens, it would result in the need for another procedure (likely URS/LL/stent placement).  IV sedation is typically used, but in rare instances general anesthesia is used with the risk of irregular heart beat, irregular BP, stroke, MI, CVA, paralysis, coma and/or death.   - he stated he would return to the ED if he has pain uncontrolled by pain meds or develops fevers or intractable vomiting   - Patient's insurance would not cover ESWL, so he scheduled for left ureteroscopy on Thursday - schedule left ureteroscopy with laser lithotripsy and ureteral stent placement - explained to the patient how the procedure is performed and the risks involved -informed the patient that they will have an ureteral stent, which will remain in place for approximately 3-10 days and can be associated with flank pain, bladder pain, dysuria, urgency, frequency, urinary leakage, and gross hematuria. - stent may be removed in the office with a cystoscope or patient may be instructed to remove the stent themselves by the string and that is decided on the day of the procedure - residual stones within the kidney or ureter may be present after the procedure and may need to have these addressed at a different encounter - injury to the ureter is the most common intra-operative risk, it may result in an open procedure to correct the defect - infection and bleeding are also risks - explained the risks of  general anesthesia, such as: MI, CVA, paralysis, coma and/or death. - advised to contact our office or seek treatment in the ED if becomes febrile or pain/ vomiting are difficult control in order to arrange for emergent/urgent intervention    2. Microscopic hematuria   - UA w/o micro heme   3. Right renal stone -explained that this stone will not be addressed at this time -explained that we cannot predict when or if it will cause issues in the future -explained that because of its size, it will have an 80% spontaneous passage rate -recommend KUB in one year to evaluate for change in size or migration -reviewed return to clinic signs (intense right flank pain, gross heme, fevers/chills or vomiting)    4. Elevated urine bilirubin - CMP on 10/26/2023, so likely a false positive on urine dip  - results available after patient left the office, will notify by phone and repeat the UA   Return for Left URS/LL/ureteral stent placement.  These notes generated with voice recognition software. I apologize for typographical errors.  CLOTILDA HELON RIGGERS  [facility] [address]  [address] [place], [place] [address] [phone]    ESWL ORDER FORM  Expected date of procedure: 11/06/2023  Surgeon: Glendia Barba, MD  Post op standing: 2-4wk follow up w/KUB prior  Anticoagulation/Aspirin/NSAID standing order: Hold all 72 hours prior  Anesthesia standing order: MAC  VTE standing: SCD's  Dx: Left Ureteral Stone  Procedure: left Extracorporeal shock wave lithotripsy  CPT : 50590  Standing Order Set:   *NPO after mn, KUB  *NS 171ml/hr, Cipro 500mg  PO, Benadryl 25mg  PO, Valium 10mg  PO, Zofran  4mg  IV  Medications if other than standing orders:   NONE

## 2023-11-03 ENCOUNTER — Encounter: Payer: Self-pay | Admitting: Urology

## 2023-11-03 ENCOUNTER — Ambulatory Visit
Admission: RE | Admit: 2023-11-03 | Discharge: 2023-11-03 | Disposition: A | Discharge status: Home / Self Care | Attending: Urology | Admitting: Urology

## 2023-11-03 ENCOUNTER — Ambulatory Visit
Admission: RE | Admit: 2023-11-03 | Discharge: 2023-11-03 | Disposition: A | Discharge status: Home / Self Care | Source: Ambulatory Visit | Attending: Urology

## 2023-11-03 ENCOUNTER — Ambulatory Visit (INDEPENDENT_AMBULATORY_CARE_PROVIDER_SITE_OTHER): Admitting: Urology

## 2023-11-03 ENCOUNTER — Ambulatory Visit: Payer: Self-pay | Admitting: Urology

## 2023-11-03 ENCOUNTER — Other Ambulatory Visit
Admission: RE | Admit: 2023-11-03 | Discharge: 2023-11-03 | Disposition: A | Discharge status: Home / Self Care | Source: Home / Self Care | Attending: Urology | Admitting: Urology

## 2023-11-03 ENCOUNTER — Other Ambulatory Visit: Payer: Self-pay | Admitting: Urology

## 2023-11-03 VITALS — BP 134/84 | HR 69 | Ht 75.0 in | Wt 240.0 lb

## 2023-11-03 DIAGNOSIS — N200 Calculus of kidney: Secondary | ICD-10-CM | POA: Diagnosis not present

## 2023-11-03 DIAGNOSIS — N201 Calculus of ureter: Secondary | ICD-10-CM

## 2023-11-03 DIAGNOSIS — R3129 Other microscopic hematuria: Secondary | ICD-10-CM | POA: Diagnosis present

## 2023-11-03 LAB — URINALYSIS, ROUTINE W REFLEX MICROSCOPIC
Glucose, UA: NEGATIVE mg/dL
Hgb urine dipstick: NEGATIVE
Ketones, ur: 40 mg/dL — AB
Leukocytes,Ua: NEGATIVE
Nitrite: NEGATIVE
Specific Gravity, Urine: 1.025 (ref 1.005–1.030)
pH: 5.5 (ref 5.0–8.0)

## 2023-11-03 LAB — URINALYSIS, MICROSCOPIC (REFLEX)

## 2023-11-04 ENCOUNTER — Other Ambulatory Visit: Payer: Self-pay | Admitting: Urology

## 2023-11-04 ENCOUNTER — Telehealth: Payer: Self-pay

## 2023-11-04 ENCOUNTER — Other Ambulatory Visit: Payer: Self-pay

## 2023-11-04 DIAGNOSIS — N201 Calculus of ureter: Secondary | ICD-10-CM

## 2023-11-04 DIAGNOSIS — R3129 Other microscopic hematuria: Secondary | ICD-10-CM

## 2023-11-04 LAB — URINE CULTURE: Culture: NO GROWTH

## 2023-11-04 NOTE — Progress Notes (Signed)
 Surgical Physician Order Form [facility] [place]   * Scheduling expectation :  08/28 w/ Dr. Twylla    *Length of Case:    *Clearance needed: no   *Anticoagulation Instructions: Hold all anticoagulants   *Aspirin Instructions: Hold Aspirin   *Post-op visit Date/Instructions:   TBD   *Diagnosis: Left Ureteral Stone   *Procedure: left Ureteroscopy w/laser lithotripsy & stent placement (47643)     Additional orders: N/A   -Admit type: OUTpatient   -Anesthesia: General   -VTE Prophylaxis Standing Order SCD's       Other:    -Standing Lab Orders Per Anesthesia     Lab other: None   -Standing Test orders EKG/Chest x-ray per Anesthesia             Test other:    - Medications:  Ancef  2gm IV   -Other orders:  N/A

## 2023-11-04 NOTE — Telephone Encounter (Signed)
 Per Dr. Twylla, Patient is to be scheduled for  Left Ureteroscopy with Laser Lithotripsy and Stent Placement   Eugene Coleman was contacted and possible surgical dates were discussed, Thursday August 28th, 2028 was agreed upon for surgery.   Patient was directed to call [phone] between 1-3pm the day before surgery to find out surgical arrival time.  Instructions were given not to eat or drink from midnight on the night before surgery and have a driver for the day of surgery. On the surgery day patient was instructed to enter through the Medical Mall entrance of [facility] report the Same Day Surgery desk.   Pre-Admit Testing will be in contact via phone to set up an interview with the anesthesia team to review your history and medications prior to surgery.   Reminder of this information was sent via MyChart to the patient.

## 2023-11-04 NOTE — Progress Notes (Signed)
   [facility] Surgical Posting Form  Surgery Date: Date: 11/06/2023  Surgeon: Dr. Glendia Barba, MD  Inpt ( No  )   Outpt (Yes)   Obs ( No  )   Diagnosis: N20.1 Left Ureteral Stone  -CPT: [phone]  Surgery: Left Ureteroscopy with Laser Lithotripsy and Stent Placement  Stop Anticoagulations: No, may continue all  Cardiac/Medical/Pulmonary Clearance needed: no  *Orders entered into EPIC  Date: 11/04/23   *Case booked in [place]  Date: 11/04/23  *Notified pt of Surgery: Date: 11/04/23  PRE-OP UA & CX: no  *Placed into Prior Authorization Work [place] Date: 11/04/23  Assistant/laser/rep:No

## 2023-11-05 ENCOUNTER — Other Ambulatory Visit: Payer: Self-pay

## 2023-11-05 ENCOUNTER — Encounter
Admission: RE | Admit: 2023-11-05 | Discharge: 2023-11-05 | Disposition: A | Discharge status: Home / Self Care | Source: Ambulatory Visit | Attending: Urology | Admitting: Urology

## 2023-11-05 DIAGNOSIS — I10 Essential (primary) hypertension: Secondary | ICD-10-CM

## 2023-11-05 HISTORY — DX: Rheumatoid arthritis, unspecified: M06.9

## 2023-11-05 HISTORY — DX: Pneumonia, unspecified organism: J18.9

## 2023-11-05 NOTE — Patient Instructions (Addendum)
 Your procedure is scheduled on: 11/06/23  Report to the Registration Desk on the 1st floor of the Medical Mall. To find out your arrival time, please call [phone] between 1PM - 3PM on: 11/05/23 If your arrival time is 6:00 am, do not arrive before that time as the Medical Mall entrance doors do not open until 6:00 am.  REMEMBER: Instructions that are not followed completely may result in serious medical risk, up to and including death; or upon the discretion of your surgeon and anesthesiologist your surgery may need to be rescheduled.  Do not eat food or drink any liquids after midnight the night before surgery.  No gum chewing or hard candies.   One week prior to surgery: Stop Anti-inflammatories (NSAIDS) such as Advil, Aleve , Ibuprofen, Motrin, Naproxen , Naprosyn  and Aspirin based products such as Excedrin, Goody's Powder, BC Powder. You may take Tylenol  if needed for pain up until the day of surgery.  Stop ANY OVER THE COUNTER supplements until after surgery : Niacin , METHYLENE BLUE and MULTIVITAMIN WITH MINERALS   ON THE DAY OF SURGERY ONLY TAKE THESE MEDICATIONS WITH SIPS OF WATER:  amLODipine  (NORVASC )    No Alcohol for 24 hours before or after surgery.  No Smoking including e-cigarettes for 24 hours before surgery.  No chewable tobacco products for at least 6 hours before surgery.  No nicotine patches on the day of surgery.  Do not use any recreational drugs for at least a week (preferably 2 weeks) before your surgery.  Please be advised that the combination of cocaine and anesthesia may have negative outcomes, up to and including death. If you test positive for cocaine, your surgery will be cancelled.  On the morning of surgery brush your teeth with toothpaste and water, you may rinse your mouth with mouthwash if you wish. Do not swallow any toothpaste or mouthwash.  Do not wear jewelry, make-up, hairpins, clips or nail polish.  For welded (permanent) jewelry:  bracelets, anklets, waist bands, etc.  Please have this removed prior to surgery.  If it is not removed, there is a chance that hospital personnel will need to cut it off on the day of surgery.  Do not wear lotions, powders, or perfumes.   Do not shave body hair from the neck down 48 hours before surgery.  Contact lenses, hearing aids and dentures may not be worn into surgery.  Do not bring valuables to the hospital. [facility] is not responsible for any missing/lost belongings or valuables.   Notify your doctor if there is any change in your medical condition (cold, fever, infection).  Wear comfortable clothing (specific to your surgery type) to the hospital.  After surgery, you can help prevent lung complications by doing breathing exercises.  Take deep breaths and cough every 1-2 hours. Your doctor may order a device called an Incentive Spirometer to help you take deep breaths.  When coughing or sneezing, hold a pillow firmly against your incision with both hands. This is called "splinting." Doing this helps protect your incision. It also decreases belly discomfort.  If you are being admitted to the hospital overnight, leave your suitcase in the car. After surgery it may be brought to your room.  In case of increased patient census, it may be necessary for you, the patient, to continue your postoperative care in the Same Day Surgery department.  If you are being discharged the day of surgery, you will not be allowed to drive home. You will need a responsible  individual to drive you home and stay with you for 24 hours after surgery.   If you are taking public transportation, you will need to have a responsible individual with you.  Please call the Pre-admissions Testing Dept. at [phone] if you have any questions about these instructions.  Surgery Visitation Policy:  Patients having surgery or a procedure may have two visitors.  Children under the age of 52 must have an  adult with them who is not the patient.  Inpatient Visitation:    Visiting hours are 7 a.m. to 8 p.m. Up to four visitors are allowed at one time in a patient room. The visitors may rotate out with other people during the day.  One visitor age 82 or older may stay with the patient overnight and must be in the room by 8 p.m.   Merchandiser, retail to address health-related social needs:  [email or web address]

## 2023-11-06 ENCOUNTER — Ambulatory Visit
Admission: RE | Admit: 2023-11-06 | Discharge: 2023-11-06 | Disposition: A | Discharge status: Home / Self Care | Attending: Urology | Admitting: Urology

## 2023-11-06 ENCOUNTER — Encounter: Payer: Self-pay | Admitting: Urology

## 2023-11-06 ENCOUNTER — Encounter
Admission: RE | Disposition: A | Discharge status: Home / Self Care | Payer: Self-pay | Source: Home / Self Care | Attending: Urology

## 2023-11-06 ENCOUNTER — Ambulatory Visit: Admitting: Anesthesiology

## 2023-11-06 ENCOUNTER — Ambulatory Visit

## 2023-11-06 DIAGNOSIS — K219 Gastro-esophageal reflux disease without esophagitis: Secondary | ICD-10-CM | POA: Insufficient documentation

## 2023-11-06 DIAGNOSIS — F32A Depression, unspecified: Secondary | ICD-10-CM | POA: Diagnosis not present

## 2023-11-06 DIAGNOSIS — I10 Essential (primary) hypertension: Secondary | ICD-10-CM | POA: Diagnosis not present

## 2023-11-06 DIAGNOSIS — N132 Hydronephrosis with renal and ureteral calculous obstruction: Secondary | ICD-10-CM | POA: Insufficient documentation

## 2023-11-06 DIAGNOSIS — F419 Anxiety disorder, unspecified: Secondary | ICD-10-CM | POA: Diagnosis not present

## 2023-11-06 DIAGNOSIS — N201 Calculus of ureter: Secondary | ICD-10-CM

## 2023-11-06 DIAGNOSIS — Z79899 Other long term (current) drug therapy: Secondary | ICD-10-CM | POA: Diagnosis not present

## 2023-11-06 HISTORY — PX: CYSTOSCOPY/URETEROSCOPY/HOLMIUM LASER/STENT PLACEMENT: SHX6546

## 2023-11-06 SURGERY — CYSTOSCOPY/URETEROSCOPY/HOLMIUM LASER/STENT PLACEMENT
Anesthesia: General | Laterality: Left

## 2023-11-06 MED ORDER — DEXAMETHASONE SODIUM PHOSPHATE 10 MG/ML IJ SOLN
INTRAMUSCULAR | Status: DC | PRN
  Administered 2023-11-06: 8 mg via INTRAVENOUS

## 2023-11-06 MED ORDER — OXYCODONE HCL 5 MG PO TABS: 5.0000 mg | ORAL_TABLET | Freq: Once | ORAL | Status: DC | PRN

## 2023-11-06 MED ORDER — GLYCOPYRROLATE 0.2 MG/ML IJ SOLN
INTRAMUSCULAR | Status: DC | PRN
  Administered 2023-11-06: .2 mg via INTRAVENOUS

## 2023-11-06 MED ORDER — LACTATED RINGERS IV SOLN: INTRAVENOUS | Status: DC

## 2023-11-06 MED ORDER — FENTANYL CITRATE (PF) 100 MCG/2ML IJ SOLN
INTRAMUSCULAR | Status: DC | PRN
  Administered 2023-11-06 (×2): 50 ug via INTRAVENOUS

## 2023-11-06 MED ORDER — SODIUM CHLORIDE 0.9 % IR SOLN
Status: DC | PRN
  Administered 2023-11-06: 3000 mL

## 2023-11-06 MED ORDER — ACETAMINOPHEN 10 MG/ML IV SOLN
INTRAVENOUS | Status: AC
Start: 2023-11-06 — End: 2023-11-06
  Filled 2023-11-06: qty 100

## 2023-11-06 MED ORDER — MIDAZOLAM HCL 2 MG/2ML IJ SOLN
INTRAMUSCULAR | Status: AC
  Filled 2023-11-06: qty 2

## 2023-11-06 MED ORDER — OXYCODONE HCL 5 MG/5ML PO SOLN: 5.0000 mg | Freq: Once | ORAL | Status: DC | PRN

## 2023-11-06 MED ORDER — CEFAZOLIN SODIUM-DEXTROSE 2-4 GM/100ML-% IV SOLN
2.0000 g | INTRAVENOUS | Status: AC
  Administered 2023-11-06: 2 g via INTRAVENOUS

## 2023-11-06 MED ORDER — EPHEDRINE SULFATE-NACL 50-0.9 MG/10ML-% IV SOSY
PREFILLED_SYRINGE | INTRAVENOUS | Status: DC | PRN
  Administered 2023-11-06 (×3): 5 mg via INTRAVENOUS

## 2023-11-06 MED ORDER — CHLORHEXIDINE GLUCONATE 0.12 % MT SOLN
OROMUCOSAL | Status: AC
  Filled 2023-11-06: qty 15

## 2023-11-06 MED ORDER — PROPOFOL 10 MG/ML IV BOLUS
INTRAVENOUS | Status: DC | PRN
  Administered 2023-11-06: 180 mg via INTRAVENOUS

## 2023-11-06 MED ORDER — ORAL CARE MOUTH RINSE: 15.0000 mL | Freq: Once | OROMUCOSAL | Status: AC

## 2023-11-06 MED ORDER — TAMSULOSIN HCL 0.4 MG PO CAPS: 0.4000 mg | ORAL_CAPSULE | Freq: Every day | ORAL | 0 refills | Status: AC

## 2023-11-06 MED ORDER — ACETAMINOPHEN 10 MG/ML IV SOLN
INTRAVENOUS | Status: DC | PRN
  Administered 2023-11-06: 1000 mg via INTRAVENOUS

## 2023-11-06 MED ORDER — OXYBUTYNIN CHLORIDE 5 MG PO TABS: ORAL_TABLET | ORAL | 0 refills | Status: AC

## 2023-11-06 MED ORDER — PROPOFOL 10 MG/ML IV BOLUS
INTRAVENOUS | Status: AC
  Filled 2023-11-06: qty 20

## 2023-11-06 MED ORDER — IOHEXOL 180 MG/ML  SOLN
INTRAMUSCULAR | Status: DC | PRN
  Administered 2023-11-06: 40 mL

## 2023-11-06 MED ORDER — DEXMEDETOMIDINE HCL IN NACL 80 MCG/20ML IV SOLN
INTRAVENOUS | Status: DC | PRN
  Administered 2023-11-06: 8 ug via INTRAVENOUS
  Administered 2023-11-06: 12 ug via INTRAVENOUS

## 2023-11-06 MED ORDER — CHLORHEXIDINE GLUCONATE 0.12 % MT SOLN
15.0000 mL | Freq: Once | OROMUCOSAL | Status: AC
  Administered 2023-11-06: 15 mL via OROMUCOSAL

## 2023-11-06 MED ORDER — FENTANYL CITRATE (PF) 100 MCG/2ML IJ SOLN
INTRAMUSCULAR | Status: AC
  Filled 2023-11-06: qty 2

## 2023-11-06 MED ORDER — ONDANSETRON HCL 4 MG/2ML IJ SOLN
INTRAMUSCULAR | Status: DC | PRN
  Administered 2023-11-06: 4 mg via INTRAVENOUS

## 2023-11-06 MED ORDER — LIDOCAINE HCL (CARDIAC) PF 100 MG/5ML IV SOSY
PREFILLED_SYRINGE | INTRAVENOUS | Status: DC | PRN
Start: 2023-11-06 — End: 2023-11-06
  Administered 2023-11-06: 80 mg via INTRAVENOUS

## 2023-11-06 MED ORDER — PHENYLEPHRINE 80 MCG/ML (10ML) SYRINGE FOR IV PUSH (FOR BLOOD PRESSURE SUPPORT)
PREFILLED_SYRINGE | INTRAVENOUS | Status: DC | PRN
Start: 2023-11-06 — End: 2023-11-06
  Administered 2023-11-06 (×2): 160 ug via INTRAVENOUS
  Administered 2023-11-06 (×3): 80 ug via INTRAVENOUS
  Administered 2023-11-06: 160 ug via INTRAVENOUS

## 2023-11-06 MED ORDER — CEFAZOLIN SODIUM-DEXTROSE 2-4 GM/100ML-% IV SOLN
INTRAVENOUS | Status: AC
  Filled 2023-11-06: qty 100

## 2023-11-06 MED ORDER — FENTANYL CITRATE (PF) 100 MCG/2ML IJ SOLN: 25.0000 ug | INTRAMUSCULAR | Status: DC | PRN

## 2023-11-06 SURGICAL SUPPLY — 24 items
BAG DRAIN SIEMENS DORNER NS (MISCELLANEOUS) ×1 IMPLANT
BASKET LASER NITINOL 1.9FR (BASKET) IMPLANT
BASKET ZERO TIP 1.9FR (BASKET) IMPLANT
BRUSH SCRUB EZ 4% CHG (MISCELLANEOUS) ×1 IMPLANT
CATH URET FLEX-TIP 2 LUMEN 10F (CATHETERS) IMPLANT
CATH URETL OPEN END 6X70 (CATHETERS) IMPLANT
CNTNR URN SCR LID CUP LEK RST (MISCELLANEOUS) IMPLANT
DRAPE UTILITY 15X26 TOWEL STRL (DRAPES) ×1 IMPLANT
FIBER LASER MOSES 200 DFL (Laser) IMPLANT
FIBER LASER MOSES 365 DFL (Laser) ×1 IMPLANT
GLOVE BIOGEL PI IND STRL 7.5 (GLOVE) ×1 IMPLANT
GOWN STRL REUS W/ TWL LRG LVL3 (GOWN DISPOSABLE) ×1 IMPLANT
GOWN STRL REUS W/ TWL XL LVL3 (GOWN DISPOSABLE) ×1 IMPLANT
GUIDEWIRE STR DUAL SENSOR (WIRE) ×1 IMPLANT
KIT TURNOVER CYSTO (KITS) ×1 IMPLANT
PACK CYSTO AR (MISCELLANEOUS) ×1 IMPLANT
SET CYSTO W/LG BORE CLAMP LF (SET/KITS/TRAYS/PACK) ×1 IMPLANT
SHEATH NAVIGATOR HD 12/14X36 (SHEATH) IMPLANT
SOL .9 NS 3000ML IRR UROMATIC (IV SOLUTION) ×1 IMPLANT
STENT URET 6FRX24 CONTOUR (STENTS) IMPLANT
STENT URET 6FRX26 CONTOUR (STENTS) IMPLANT
SURGILUBE 2OZ TUBE FLIPTOP (MISCELLANEOUS) ×1 IMPLANT
VALVE UROSEAL ADJ ENDO (VALVE) IMPLANT
WATER STERILE IRR 500ML POUR (IV SOLUTION) ×1 IMPLANT

## 2023-11-06 NOTE — Interval H&P Note (Signed)
 History and Physical Interval Note:  11/06/2023 9:56 AM  Eugene Coleman  has presented today for surgery, with the diagnosis of Left Ureteral Stone.  The various methods of treatment have been discussed with the patient and family. After consideration of risks, benefits and other options for treatment, the patient has consented to  Procedure(s): CYSTOSCOPY/URETEROSCOPY/HOLMIUM LASER/STENT PLACEMENT (Left) as a surgical intervention.  The patient's history has been reviewed, patient examined, no change in status, stable for surgery.  I have reviewed the patient's chart and labs.  We discussed there is a small chance the stone will not be able to be treated due to inability to access the upper ureter with the ureteroscope secondary to anatomy.  If this were to occur a stent would be placed and he would need follow-up ureteroscopy after a period of passive stent dilation.  We also discussed possibility of stent related bladder symptoms/pain.  Questions were answered to the patient's satisfaction.    CV: RRR Lungs: Clear  Tom Macpherson C Elice Crigger

## 2023-11-06 NOTE — Anesthesia Postprocedure Evaluation (Signed)
 Anesthesia Post Note  Patient: Eugene Coleman  Procedure(s) Performed: CYSTOSCOPY/URETEROSCOPY/HOLMIUM LASER/STENT PLACEMENT (Left)  Patient location during evaluation: PACU Anesthesia Type: General Level of consciousness: awake and alert Pain management: pain level controlled Vital Signs Assessment: post-procedure vital signs reviewed and stable Respiratory status: spontaneous breathing, nonlabored ventilation, respiratory function stable and patient connected to nasal cannula oxygen Cardiovascular status: blood pressure returned to baseline and stable Postop Assessment: no apparent nausea or vomiting Anesthetic complications: no   No notable events documented.   Last Vitals:  Vitals:   11/06/23 1145 11/06/23 1157  BP: 118/73 123/81  Pulse: 76 60  Resp: 11 15  Temp: (!) 36.2 C (!) 36.3 C  SpO2: 97% 100%    Last Pain:  Vitals:   11/06/23 1157  TempSrc: Temporal  PainSc: 3                  Lendia LITTIE Mae

## 2023-11-06 NOTE — Transfer of Care (Signed)
 Immediate Anesthesia Transfer of Care Note  Patient: Eugene Coleman  Procedure(s) Performed: CYSTOSCOPY/URETEROSCOPY/HOLMIUM LASER/STENT PLACEMENT (Left)  Patient Location: PACU  Anesthesia Type:General  Level of Consciousness: drowsy  Airway & Oxygen Therapy: Patient Spontanous Breathing and Patient connected to face mask oxygen  Post-op Assessment: Report given to RN  Post vital signs: stable  Last Vitals:  Vitals Value Taken Time  BP 114/67 11/06/23 11:17  Temp 36.1 C 11/06/23 11:17  Pulse 71 11/06/23 11:18  Resp 17 11/06/23 11:18  SpO2 97 % 11/06/23 11:18  Vitals shown include unfiled device data.  Last Pain:  Vitals:   11/06/23 1117  TempSrc:   PainSc: Asleep         Complications: No notable events documented.

## 2023-11-06 NOTE — Op Note (Signed)
   Preoperative diagnosis:  Left proximal ureteral calculus  Postoperative diagnosis:  Left proximal ureteral calculus  Procedure:  Cystoscopy Left ureteroscopy and stone removal Ureteroscopic laser lithotripsy Left ureteral stent placement (23F/26 cm) Left retrograde pyelography with interpretation  Surgeon: Eugene C. Zarif Rathje, M.D.  Anesthesia: General  Complications: None  Intraoperative findings:  Cystoscopy: Urethra normal in caliber without stricture; prostate with mild lateral lobe enlargement.  Bladder mucosa without solid or papillary lesions; UOs normal-appearing bilaterally. Ureteroscopy: Non-impacted calculus proximal ureter.  Slight ureteral narrowing just distal to the stone Left retrograde pyelography post procedure showed no filling defects, stone fragments or contrast extravasation.  Pyelovenous backflow noted  EBL: Minimal  Specimens: Calculus fragment for analysis   Indication: Eugene Coleman is a 53 y.o.  male with a 5 mm left proximal ureteral calculus with renal colic. After reviewing the management options for treatment, the patient elected to proceed with the above surgical procedure(s). We have discussed the potential benefits and risks of the procedure, side effects of the proposed treatment, the likelihood of the patient achieving the goals of the procedure, and any potential problems that might occur during the procedure or recuperation. Informed consent has been obtained.  Description of procedure:  The patient was taken to the operating room and general anesthesia was induced.  The patient was placed in the dorsal lithotomy position, prepped and draped in the usual sterile fashion, and preoperative antibiotics were administered. A preoperative time-out was performed.   A 21 French cystoscope was lubricated, placed per urethra and advanced into the bladder under direct vision with findings as described above.    Attention was directed to the left  ureteral orifice and a 0.038 Sensor wire was then advanced up the ureter into the renal pelvis under fluoroscopic guidance.  A 4.5 Fr semirigid ureteroscope was then advanced into the ureter next to the guidewire and the calculus was identified as described above.  The stone was then fragmented with a 365 m Moses holmium laser fiber at a setting of 0.3 J/40 hz.   All fragments were then removed from the ureter with a 1.22F Escape basket.  Reinspection of the ureter revealed no remaining visible stones or fragments.   Retrograde pyelogram was performed with findings as described above.  A 6 F/26 cm Contour ureteral stent without tether was placed under fluoroscopic guidance.  The wire was then removed.  The proximal tip of the stent was in an upper pole calyx.  Renal pelvis was narrow.  Good curl noted in the bladder.  The bladder was then emptied and the procedure ended.  The patient appeared to tolerate the procedure well and without complications.  After anesthetic reversal the patient was transported to the PACU in stable condition.   Plan: Stent removal will be scheduled Wednesday, 11/12/2023   Eugene Barba, MD

## 2023-11-06 NOTE — Anesthesia Preprocedure Evaluation (Signed)
 Anesthesia Evaluation  Patient identified by MRN, date of birth, ID band Patient awake    Reviewed: Allergy & Precautions, NPO status , Patient's Chart, lab work & pertinent test results  History of Anesthesia Complications Negative for: history of anesthetic complications  Airway Mallampati: III  TM Distance: >3 FB Neck ROM: full    Dental  (+) Chipped   Pulmonary neg pulmonary ROS   Pulmonary exam normal        Cardiovascular hypertension, On Medications negative cardio ROS Normal cardiovascular exam     Neuro/Psych  PSYCHIATRIC DISORDERS Anxiety Depression     Neuromuscular disease    GI/Hepatic Neg liver ROS,GERD  ,,  Endo/Other  negative endocrine ROS    Renal/GU      Musculoskeletal   Abdominal   Peds  Hematology negative hematology ROS (+)   Anesthesia Other Findings Past Medical History: No date: Allergy 07/03/2017: Depression with anxiety No date: Difficult airway for intubation     Comment:  a.) age 53 - intubation difficulties with knee surgery -              patient states, they almost tore my trachea up; reports              waking up during intubation process and prolonged               postoperative sore throat; multiple intubations since               with no documented difficulties No date: Gallstones 11/06/2012: Generalized anxiety disorder No date: GERD (gastroesophageal reflux disease) No date: Hyperlipidemia No date: Hypertension No date: IBS (irritable bowel syndrome) 12/09/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 07/10/2017: Multiple thyroid  nodules No date: Pneumonia No date: Rheumatoid arthritis (HCC) 04/29/2017: Rheumatoid factor positive with cyclic citrullinated  peptide (CCP) antibody negative 07/10/2017: Right thyroid  nodule No date: Small intestinal bacterial overgrowth (SIBO) 06/18/2017: Ulnar neuropathy at elbow of right upper extremity  Past Surgical History: No date:  CHOLECYSTECTOMY 04/04/2022: COLONOSCOPY WITH PROPOFOL ; N/A     Comment:  Procedure: COLONOSCOPY WITH PROPOFOL ;  Surgeon: Aneita Gwendlyn DASEN, MD;  Location: [facility];  Service:               Gastroenterology;  Laterality: N/A; 04/04/2022: ESOPHAGEAL DILATION     Comment:  Procedure: ESOPHAGEAL DILATION;  Surgeon: Aneita Gwendlyn DASEN, MD;  Location: [facility];  Service:               Gastroenterology;; 04/04/2022: ESOPHAGOGASTRODUODENOSCOPY (EGD) WITH PROPOFOL ; N/A     Comment:  Procedure: ESOPHAGOGASTRODUODENOSCOPY (EGD) WITH               PROPOFOL ;  Surgeon: Aneita Gwendlyn DASEN, MD;  Location: [facility];  Service: Gastroenterology;  Laterality: N/A; No date: HERNIA REPAIR 04/04/2022: HOT HEMOSTASIS; N/A     Comment:  Procedure: HOT HEMOSTASIS (ARGON PLASMA               COAGULATION/BICAP);  Surgeon: Aneita Gwendlyn DASEN, MD;                Location: [facility];  Service: Gastroenterology;                Laterality: N/A; No date: INGUINAL HERNIA REPAIR; Bilateral  Comment:  with Vasectomy  [id number]: KNEE SURGERY; Bilateral     Comment:  left x 2, right x 1 04/04/2022: POLYPECTOMY     Comment:  Procedure: POLYPECTOMY;  Surgeon: Aneita Gwendlyn DASEN, MD;               Location: [facility];  Service: Gastroenterology;; No date: TONSILLECTOMY No date: VASECTOMY No date: WISDOM TOOTH EXTRACTION     Comment:  childhood     Reproductive/Obstetrics negative OB ROS                              Anesthesia Physical Anesthesia Plan  ASA: 2  Anesthesia Plan: General LMA   Post-op Pain Management: Toradol  IV (intra-op)* and Ofirmev  IV (intra-op)*   Induction: Intravenous  PONV Risk Score and Plan: 2 and Dexamethasone , Ondansetron , Midazolam  and Treatment may vary due to age or medical condition  Airway Management Planned: LMA  Additional Equipment:   Intra-op Plan:   Post-operative Plan: Extubation in  OR  Informed Consent: I have reviewed the patients History and Physical, chart, labs and discussed the procedure including the risks, benefits and alternatives for the proposed anesthesia with the patient or authorized representative who has indicated his/her understanding and acceptance.     Dental Advisory Given  Plan Discussed with: Anesthesiologist, CRNA and Surgeon  Anesthesia Plan Comments: (Patient consented for risks of anesthesia including but not limited to:  - adverse reactions to medications - damage to eyes, teeth, lips or other oral mucosa - nerve damage due to positioning  - sore throat or hoarseness - Damage to heart, brain, nerves, lungs, other parts of body or loss of life  Patient voiced understanding and assent.)        Anesthesia Quick Evaluation

## 2023-11-06 NOTE — Anesthesia Procedure Notes (Signed)
 Procedure Name: LMA Insertion Date/Time: 11/06/2023 10:08 AM  Performed by: Rosine Shona Jansky, CRNAPre-anesthesia Checklist: Patient identified, Emergency Drugs available, Suction available and Patient being monitored Patient Re-evaluated:Patient Re-evaluated prior to induction Oxygen Delivery Method: Circle system utilized Preoxygenation: Pre-oxygenation with 100% oxygen Induction Type: IV induction Ventilation: Mask ventilation without difficulty LMA: LMA inserted LMA Size: 5.0 Number of attempts: 1 Placement Confirmation: positive ETCO2 and breath sounds checked- equal and bilateral Tube secured with: Tape Dental Injury: Teeth and Oropharynx as per pre-operative assessment

## 2023-11-06 NOTE — Discharge Instructions (Addendum)
 DISCHARGE INSTRUCTIONS FOR KIDNEY STONE/URETERAL STENT   MEDICATIONS:  1. Resume all your other meds from home.  2.  AZO (over-the-counter) can help with the burning/stinging when you urinate. 3.  Tamsulosin  and oxybutynin  are for hydro/stent irritation, Rxs were sent to your pharmacy.  ACTIVITY:  1. May resume regular activities in 24 hours. 2. No driving while on narcotic pain medications  3. Drink plenty of water  4. Continue to walk at home - you can still get blood clots when you are at home, so keep active, but don't over do it.  5. May return to work/school tomorrow or when you feel ready    SIGNS/SYMPTOMS TO CALL:  Common postoperative symptoms include urinary frequency, urgency, bladder spasm and blood in the urine  Please call us  if you have a fever greater than 101.5, uncontrolled nausea/vomiting, uncontrolled pain, dizziness, unable to urinate, excessively bloody urine, chest pain, shortness of breath, leg swelling, leg pain, or any other concerns or questions.   You can reach us  at [phone].   FOLLOW-UP:  1. You will be contacted for an appointment for stent removal next Wednesday, 11/12/2023

## 2023-11-07 ENCOUNTER — Encounter: Payer: Self-pay | Admitting: Urology

## 2023-11-12 ENCOUNTER — Encounter: Payer: Self-pay | Admitting: Urology

## 2023-11-12 ENCOUNTER — Ambulatory Visit: Admitting: Urology

## 2023-11-12 VITALS — BP 144/92 | HR 70 | Ht 75.0 in | Wt 240.0 lb

## 2023-11-12 DIAGNOSIS — Z466 Encounter for fitting and adjustment of urinary device: Secondary | ICD-10-CM | POA: Diagnosis not present

## 2023-11-12 LAB — URINALYSIS, COMPLETE
Bilirubin, UA: NEGATIVE
Glucose, UA: NEGATIVE
Nitrite, UA: NEGATIVE
Specific Gravity, UA: 1.02 (ref 1.005–1.030)
Urobilinogen, Ur: 0.2 mg/dL (ref 0.2–1.0)
pH, UA: 6 (ref 5.0–7.5)

## 2023-11-12 LAB — MICROSCOPIC EXAMINATION: RBC, Urine: >30 /HPF — AB (ref 0–2)

## 2023-11-12 MED ORDER — SULFAMETHOXAZOLE-TRIMETHOPRIM 800-160 MG PO TABS
1.0000 | ORAL_TABLET | Freq: Once | ORAL | Status: AC
Start: 2023-11-12 — End: 2023-11-12
  Administered 2023-11-12: 1 via ORAL

## 2023-11-12 NOTE — Progress Notes (Signed)
   Indications: Patient is 53 y.o., who is s/p ureteroscopic removal of a left proximal ureteral calculus 11/06/2023.  The patient is presenting today for stent removal.  No postoperative problems and mild stent irritative symptoms.  UA today microscopy 6-10 WBC/>30 RBC  Procedure:  Flexible Cystoscopy with stent removal (47689)  Timeout was performed and the correct patient, procedure and participants were identified.    Description:  The patient was prepped and draped in the usual sterile fashion. Flexible cystosopy was performed.  The stent was visualized, grasped, and removed intact without difficulty. The patient tolerated the procedure well.  A single dose of oral antibiotics was given.  Complications:  None  Plan:  Instructed call for fever/flank pain post removal Stone analysis pending He had mild ureteral narrowing distal to his stone and a follow-up RUS 6-8 weeks was recommended and ordered Will contact with results   Glendia Barba, MD

## 2023-11-12 NOTE — Patient Instructions (Signed)
Scheduling [phone] 

## 2023-11-17 NOTE — Telephone Encounter (Signed)
 Called pt to f/u on messages. Pt stated he was very uncomfortable on Saturday but it seems to be resolving itself. He takes tylenol  and this seems to help. He states no fevers, vomiting or other signs of infection. Pt wanted reassurance that the small clots were normal and he adviced understanding after I explained to him that it was. Pt denied needing any other pain meds at this time but did state that he would start taking his flomax  as directed to help.

## 2023-11-19 LAB — STONE ANALYSIS
Calcium Oxalate Dihydrate: 20 %
Calcium Oxalate Monohydrate: 75 %
Calcium Phosphate (Hydroxyl): 5 %
Weight Calculi: 9 mg

## 2023-12-15 ENCOUNTER — Ambulatory Visit: Admission: RE | Admit: 2023-12-15 | Source: Ambulatory Visit

## 2024-11-12 ENCOUNTER — Ambulatory Visit: Admitting: Urology
